# Patient Record
Sex: Female | Born: 1970 | Race: Black or African American | Hispanic: No | Marital: Single | Smoking: Current some day smoker
Health system: Southern US, Community
[De-identification: ages and names within clinical notes are randomized; demographics above are authoritative.]

## PROBLEM LIST (undated history)

## (undated) DIAGNOSIS — L309 Dermatitis, unspecified: Secondary | ICD-10-CM

## (undated) HISTORY — PX: KNEE SURGERY: SHX244

---

## 2006-05-03 ENCOUNTER — Emergency Department (HOSPITAL_COMMUNITY): Admission: EM | Admit: 2006-05-03 | Discharge: 2006-05-03 | Payer: Self-pay | Admitting: Family Medicine

## 2006-05-30 ENCOUNTER — Emergency Department (HOSPITAL_COMMUNITY): Admission: EM | Admit: 2006-05-30 | Discharge: 2006-05-30 | Payer: Self-pay | Admitting: Emergency Medicine

## 2007-06-16 ENCOUNTER — Emergency Department (HOSPITAL_COMMUNITY): Admission: EM | Admit: 2007-06-16 | Discharge: 2007-06-17 | Payer: Self-pay | Admitting: Emergency Medicine

## 2007-08-20 ENCOUNTER — Emergency Department (HOSPITAL_COMMUNITY): Admission: EM | Admit: 2007-08-20 | Discharge: 2007-08-20 | Payer: Self-pay | Admitting: Emergency Medicine

## 2008-01-24 ENCOUNTER — Emergency Department (HOSPITAL_COMMUNITY): Admission: EM | Admit: 2008-01-24 | Discharge: 2008-01-24 | Payer: Self-pay | Admitting: Emergency Medicine

## 2008-01-28 ENCOUNTER — Emergency Department (HOSPITAL_COMMUNITY): Admission: EM | Admit: 2008-01-28 | Discharge: 2008-01-28 | Payer: Self-pay | Admitting: Family Medicine

## 2010-07-04 ENCOUNTER — Inpatient Hospital Stay (INDEPENDENT_AMBULATORY_CARE_PROVIDER_SITE_OTHER)
Admission: RE | Admit: 2010-07-04 | Discharge: 2010-07-04 | Disposition: A | Discharge status: Home / Self Care | Payer: Self-pay | Source: Ambulatory Visit | Attending: Emergency Medicine | Admitting: Emergency Medicine

## 2010-07-04 DIAGNOSIS — J020 Streptococcal pharyngitis: Secondary | ICD-10-CM

## 2010-07-04 DIAGNOSIS — J039 Acute tonsillitis, unspecified: Secondary | ICD-10-CM

## 2010-07-04 LAB — POCT RAPID STREP A (OFFICE): Streptococcus, Group A Screen (Direct): POSITIVE — AB

## 2010-07-07 ENCOUNTER — Inpatient Hospital Stay (INDEPENDENT_AMBULATORY_CARE_PROVIDER_SITE_OTHER)
Admission: RE | Admit: 2010-07-07 | Discharge: 2010-07-07 | Disposition: A | Discharge status: Home / Self Care | Payer: Self-pay | Source: Ambulatory Visit | Attending: Family Medicine | Admitting: Family Medicine

## 2010-07-07 DIAGNOSIS — J020 Streptococcal pharyngitis: Secondary | ICD-10-CM

## 2010-07-07 DIAGNOSIS — B009 Herpesviral infection, unspecified: Secondary | ICD-10-CM

## 2010-12-15 LAB — RAPID STREP SCREEN (MED CTR MEBANE ONLY): Streptococcus, Group A Screen (Direct): NEGATIVE

## 2010-12-23 LAB — GC/CHLAMYDIA PROBE AMP, GENITAL
Chlamydia, DNA Probe: NEGATIVE
GC Probe Amp, Genital: NEGATIVE

## 2010-12-23 LAB — WET PREP, GENITAL

## 2010-12-23 LAB — RAPID STREP SCREEN (MED CTR MEBANE ONLY): Streptococcus, Group A Screen (Direct): POSITIVE — AB

## 2011-02-19 ENCOUNTER — Emergency Department (INDEPENDENT_AMBULATORY_CARE_PROVIDER_SITE_OTHER)
Admission: EM | Admit: 2011-02-19 | Discharge: 2011-02-19 | Disposition: A | Discharge status: Home / Self Care | Payer: Self-pay | Source: Home / Self Care | Attending: Family Medicine | Admitting: Family Medicine

## 2011-02-19 ENCOUNTER — Encounter: Payer: Self-pay | Admitting: Emergency Medicine

## 2011-02-19 DIAGNOSIS — L309 Dermatitis, unspecified: Secondary | ICD-10-CM

## 2011-02-19 DIAGNOSIS — L259 Unspecified contact dermatitis, unspecified cause: Secondary | ICD-10-CM

## 2011-02-19 HISTORY — DX: Dermatitis, unspecified: L30.9

## 2011-02-19 MED ORDER — METHYLPREDNISOLONE ACETATE 80 MG/ML IJ SUSP
80.0000 mg | Freq: Once | INTRAMUSCULAR | Status: AC
  Administered 2011-02-19: 80 mg via INTRAMUSCULAR

## 2011-02-19 MED ORDER — TRIAMCINOLONE ACETONIDE 0.1 % EX CREA: TOPICAL_CREAM | Freq: Two times a day (BID) | CUTANEOUS | Status: DC

## 2011-02-19 MED ORDER — METHYLPREDNISOLONE ACETATE 80 MG/ML IJ SUSP
INTRAMUSCULAR | Status: AC
Start: 2011-02-19 — End: 2011-02-19
  Filled 2011-02-19: qty 1

## 2011-02-19 NOTE — ED Notes (Signed)
Multiple complaints.  Left upper back/shoulder soreness.  Patient reports moving boxes approx 2 weeks ago.  Pain continues.  Also concerned about eczema.

## 2011-02-19 NOTE — ED Notes (Signed)
Patient concerned for shoulder pain.  Dawn evaluated primary complaint-eczema.  This nurse instructed patient , dawn pa would come back in to evaluate patient's shoulder complaint.  Patient declined.  Patient did have questions related to use of triamcinolone cream on face, verified answers.  Patient agreeable.  Again offered dawn, pa to evaluate shoulder--declined.  Patient requested note for work, provided note that patient seen today

## 2011-02-19 NOTE — ED Provider Notes (Signed)
History     CSN: [id number] Arrival date & time: 02/19/2011 10:22 AM   First MD Initiated Contact with Patient 02/19/11 1005      Chief Complaint  Patient presents with  . Eczema    (Consider location/radiation/quality/duration/timing/severity/associated sxs/prior treatment) HPI Comments: Hx of eczema. Pt states for one week has had itching and dry skin, and has rapidly worsened in the last 3 days around her neck and eyes. She has been using moisturizing lotion at home trying to help, and has had improvement on her back only. Her skin seems to worsen while she is at work, new job,  working around boxes. She has used Triamcinolone cream in the past and has worked well but is out. Her PCP is in [place] at [facility].   The history is provided by the patient.    History reviewed. No pertinent past medical history.  History reviewed. No pertinent past surgical history.  No family history on file.  History  Substance Use Topics  . Smoking status: Not on file  . Smokeless tobacco: Not on file  . Alcohol Use: Not on file    OB History    Grav Para Term Preterm Abortions TAB SAB Ect Mult Living                  Review of Systems  Constitutional: Negative for fever and chills.  Skin: Positive for rash.    Allergies  Review of patient's allergies indicates no known allergies.  Home Medications   Current Outpatient Rx  Name Route Sig Dispense Refill  . OVER THE COUNTER MEDICATION  cortizone 10 lotion     . TRIAMCINOLONE ACETONIDE 0.1 % EX CREA Topical Apply topically 2 (two) times daily. 15 g 0    BP 94/58  Pulse 80  Temp(Src) 98.4 F (36.9 C) (Oral)  Resp 16  SpO2 100%  Physical Exam  Nursing note and vitals reviewed. Constitutional: She appears well-developed and well-nourished. No distress.  Cardiovascular: Normal rate, regular rhythm and normal heart sounds.   Pulmonary/Chest: Effort normal and breath sounds normal. No respiratory  distress.  Skin: Skin is warm and dry. Rash noted.          Hyperpigmented and dry patches noted bilat periorbital, Rt ear, and posterior neck.   Psychiatric: She has a normal mood and affect.    ED Course  Procedures (including critical care time)  Labs Reviewed - No data to display No results found.   1. Eczema       MDM  Pt seen by myself before nurse triage for her complaint of eczema rash. When nurse triaged pt she also c/o Lt shoulder/back soreness. Offered to re-examine pt for new complaint but pt left/ was discharged.        Melody Comas, PA 02/19/11 [address] [place], [place] 02/19/11 1222

## 2011-02-26 NOTE — ED Provider Notes (Signed)
Medical screening examination/treatment/procedure(s) were performed by non-physician practitioner and as supervising physician I was immediately available for consultation/collaboration.  LYKINS,KIMBERLY G  D.O.    Randa Spike, MD 02/26/11 [phone]

## 2011-04-28 ENCOUNTER — Emergency Department (HOSPITAL_COMMUNITY): Payer: Self-pay

## 2011-04-28 ENCOUNTER — Encounter (HOSPITAL_COMMUNITY): Payer: Self-pay | Admitting: *Deleted

## 2011-04-28 ENCOUNTER — Emergency Department (HOSPITAL_COMMUNITY)
Admission: EM | Admit: 2011-04-28 | Discharge: 2011-04-28 | Disposition: A | Discharge status: Home / Self Care | Payer: Self-pay | Attending: Emergency Medicine | Admitting: Emergency Medicine

## 2011-04-28 DIAGNOSIS — R062 Wheezing: Secondary | ICD-10-CM | POA: Insufficient documentation

## 2011-04-28 DIAGNOSIS — L309 Dermatitis, unspecified: Secondary | ICD-10-CM

## 2011-04-28 DIAGNOSIS — J40 Bronchitis, not specified as acute or chronic: Secondary | ICD-10-CM | POA: Insufficient documentation

## 2011-04-28 DIAGNOSIS — R05 Cough: Secondary | ICD-10-CM | POA: Insufficient documentation

## 2011-04-28 DIAGNOSIS — L259 Unspecified contact dermatitis, unspecified cause: Secondary | ICD-10-CM | POA: Insufficient documentation

## 2011-04-28 DIAGNOSIS — L299 Pruritus, unspecified: Secondary | ICD-10-CM | POA: Insufficient documentation

## 2011-04-28 DIAGNOSIS — R059 Cough, unspecified: Secondary | ICD-10-CM | POA: Insufficient documentation

## 2011-04-28 DIAGNOSIS — F17200 Nicotine dependence, unspecified, uncomplicated: Secondary | ICD-10-CM | POA: Insufficient documentation

## 2011-04-28 MED ORDER — IPRATROPIUM BROMIDE 0.02 % IN SOLN
0.5000 mg | Freq: Once | RESPIRATORY_TRACT | Status: AC
  Administered 2011-04-28: 0.5 mg via RESPIRATORY_TRACT
  Filled 2011-04-28: qty 2.5

## 2011-04-28 MED ORDER — TRIAMCINOLONE ACETONIDE 0.1 % EX CREA: TOPICAL_CREAM | Freq: Two times a day (BID) | CUTANEOUS | Status: DC

## 2011-04-28 MED ORDER — ALBUTEROL SULFATE HFA 108 (90 BASE) MCG/ACT IN AERS
2.0000 | INHALATION_SPRAY | RESPIRATORY_TRACT | Status: DC | PRN
  Filled 2011-04-28: qty 6.7

## 2011-04-28 MED ORDER — ALBUTEROL SULFATE (5 MG/ML) 0.5% IN NEBU
5.0000 mg | INHALATION_SOLUTION | Freq: Once | RESPIRATORY_TRACT | Status: AC
  Administered 2011-04-28: 5 mg via RESPIRATORY_TRACT
  Filled 2011-04-28: qty 1

## 2011-04-28 NOTE — ED Provider Notes (Signed)
History     CSN: [id number]  Arrival date & time 04/28/11  1333   First MD Initiated Contact with Patient 04/28/11 1349      Chief Complaint  Patient presents with  . URI  . Rash    (Consider location/radiation/quality/duration/timing/severity/associated sxs/prior treatment) Patient is a 40 y.o. female presenting with URI. The history is provided by the patient.  URI The primary symptoms include cough, wheezing and rash. Primary symptoms do not include fever, nausea or vomiting. The current episode started 3 to 5 days ago. This is a new problem. The problem has been gradually worsening.  The cough began 3 to 5 days ago. The cough is new. The cough is non-productive, dry and hacking.  The rash began 2 to 7 days ago. The rash appears on the neck and face. The pain associated with the rash is moderate. The rash is associated with itching.  The onset of the illness is associated with exposure to sick contacts. The illness is not associated with sinus pressure, congestion or rhinorrhea.    Past Medical History  Diagnosis Date  . Eczema     History reviewed. No pertinent past surgical history.  No family history on file.  History  Substance Use Topics  . Smoking status: Current Everyday Smoker -- 0.5 packs/day  . Smokeless tobacco: Not on file  . Alcohol Use: 0.6 oz/week    1 Shots of liquor per week     occ    OB History    Grav Para Term Preterm Abortions TAB SAB Ect Mult Living                  Review of Systems  Constitutional: Negative for fever.  HENT: Negative for congestion, rhinorrhea and sinus pressure.   Respiratory: Positive for cough and wheezing.   Gastrointestinal: Negative for nausea and vomiting.  Skin: Positive for itching and rash.  All other systems reviewed and are negative.    Allergies  Review of patient's allergies indicates no known allergies.  Home Medications   Current Outpatient Rx  Name Route Sig Dispense Refill  . HYDROCORTISONE  1 % EX CREA Topical Apply 1 application topically 2 (two) times daily as needed. For eczema    . TRIAMCINOLONE ACETONIDE 0.1 % EX CREA Topical Apply 1 application topically 2 (two) times daily.      BP 112/79  Pulse 79  Temp(Src) 97.9 F (36.6 C) (Oral)  Resp 18  SpO2 100%  LMP 04/20/2011  Physical Exam  Nursing note and vitals reviewed. Constitutional: She is oriented to person, place, and time. She appears well-developed and well-nourished. No distress.  HENT:  Head: Normocephalic and atraumatic.  Right Ear: Tympanic membrane and ear canal normal.  Left Ear: Tympanic membrane and ear canal normal.  Mouth/Throat: Oropharynx is clear and moist.  Eyes: EOM are normal. Pupils are equal, round, and reactive to light.  Cardiovascular: Normal rate, regular rhythm, normal heart sounds and intact distal pulses.  Exam reveals no friction rub.   No murmur heard. Pulmonary/Chest: Effort normal. She has wheezes. She has no rales.  Abdominal: Soft. Bowel sounds are normal. She exhibits no distension. There is no tenderness. There is no rebound and no guarding.  Musculoskeletal: Normal range of motion. She exhibits no tenderness.       No edema  Neurological: She is alert and oriented to person, place, and time. No cranial nerve deficit.  Skin: Skin is warm and dry. Rash noted. Rash is maculopapular.  Psychiatric: She has a normal mood and affect. Her behavior is normal.    ED Course  Procedures (including critical care time)  Labs Reviewed - No data to display Dg Chest 2 View  04/28/2011  *RADIOLOGY REPORT*  Clinical Data: Upper respiratory tract infection.  Cough.  CHEST - 2 VIEW  Comparison: None.  Findings: There is some peribronchial thickening.  No consolidative process, pneumothorax or effusion.  Heart size is normal.  No focal bony abnormality.  IMPRESSION: Bronchitic change without focal process.  Original Report Authenticated By: Bernadene Bell. Maricela Curet, M.D.     No diagnosis  found.    MDM   Pt with symptoms consistent with viral URI last week and now is having coughing and difficulty breathing..  Well appearing here.  No signs of pharyngitis, otitis or abnormal abdominal findings.  Wheezing diffusely on exam. CXR wnl and pt to return with any further problems.  After albuterol and Atrovent peak flow 75%. Wheezing has resolved. Patient is a smoker and has a history of wheezing. Her that she should have an inhaler. No need for steroids at this time.  Also patient has a history of eczema and layer over her neck and face. She states she is out of the triamcinolone cream and will refill.         Gwyneth Sprout, MD 04/28/11 [phone]

## 2011-04-28 NOTE — ED Notes (Signed)
Pt is here with cold symptoms and cough.  Aches.  PT has outbreak of eczema.  Congestion in chest

## 2011-07-05 ENCOUNTER — Emergency Department (HOSPITAL_COMMUNITY)
Admission: EM | Admit: 2011-07-05 | Discharge: 2011-07-05 | Disposition: A | Discharge status: Home / Self Care | Payer: Medicaid Other | Attending: Emergency Medicine | Admitting: Emergency Medicine

## 2011-07-05 ENCOUNTER — Encounter (HOSPITAL_COMMUNITY): Payer: Self-pay | Admitting: *Deleted

## 2011-07-05 DIAGNOSIS — K0889 Other specified disorders of teeth and supporting structures: Secondary | ICD-10-CM

## 2011-07-05 DIAGNOSIS — R220 Localized swelling, mass and lump, head: Secondary | ICD-10-CM | POA: Insufficient documentation

## 2011-07-05 DIAGNOSIS — R6884 Jaw pain: Secondary | ICD-10-CM | POA: Insufficient documentation

## 2011-07-05 DIAGNOSIS — K089 Disorder of teeth and supporting structures, unspecified: Secondary | ICD-10-CM | POA: Insufficient documentation

## 2011-07-05 DIAGNOSIS — H9209 Otalgia, unspecified ear: Secondary | ICD-10-CM | POA: Insufficient documentation

## 2011-07-05 MED ORDER — OXYCODONE-ACETAMINOPHEN 5-325 MG PO TABS
1.0000 | ORAL_TABLET | Freq: Once | ORAL | Status: AC
  Administered 2011-07-05: 1 via ORAL
  Filled 2011-07-05: qty 1

## 2011-07-05 MED ORDER — OXYCODONE-ACETAMINOPHEN 5-325 MG PO TABS: 1.0000 | ORAL_TABLET | Freq: Four times a day (QID) | ORAL | Status: AC | PRN

## 2011-07-05 NOTE — ED Provider Notes (Signed)
History     CSN: [id number]  Arrival date & time 07/05/11  0559   First MD Initiated Contact with Patient 07/05/11 0730      Chief Complaint  Patient presents with  . Dental Pain    (Consider location/radiation/quality/duration/timing/severity/associated sxs/prior treatment) HPI Comments: Patient presents to the emergency department with a dental complaint. Symptoms began 2-3 weeks ago. The patient has tried to alleviate pain with NSAIDs.  Pain rated at a 10/10, characterized as throbbing in nature and located right upper jaw, radiates to right ear. Patient denies fever, night sweats, chills, difficulty swallowing or opening mouth, SOB, nuchal rigidity or decreased ROM of neck.  Patient does not have a dentist and requests a resource guide at discharge.   Patient is a 41 y.o. female presenting with tooth pain. The history is provided by the patient.  Dental PainPrimary symptoms do not include headaches, fever, shortness of breath or sore throat.  Additional symptoms do not include: facial swelling, trouble swallowing, drooling and ear pain.    Past Medical History  Diagnosis Date  . Eczema     History reviewed. No pertinent past surgical history.  History reviewed. No pertinent family history.  History  Substance Use Topics  . Smoking status: Current Everyday Smoker -- 0.5 packs/day  . Smokeless tobacco: Not on file  . Alcohol Use: 0.6 oz/week    1 Shots of liquor per week     occ    OB History    Grav Para Term Preterm Abortions TAB SAB Ect Mult Living                  Review of Systems  Constitutional: Negative for fever, chills, diaphoresis and activity change.  HENT: Positive for dental problem. Negative for ear pain, sore throat, facial swelling, drooling, mouth sores, trouble swallowing, neck pain, neck stiffness, voice change, sinus pressure and tinnitus.   Eyes: Negative for pain and visual disturbance.  Respiratory: Negative for shortness of breath,  wheezing and stridor.   Cardiovascular: Negative for chest pain.  Gastrointestinal: Negative for nausea and abdominal pain.  Musculoskeletal: Negative for myalgias.  Skin: Negative for rash.  Neurological: Negative for speech difficulty and headaches.  Hematological: Negative for adenopathy.  All other systems reviewed and are negative.    Allergies  Review of patient's allergies indicates no known allergies.  Home Medications   Current Outpatient Rx  Name Route Sig Dispense Refill  . ALBUTEROL SULFATE HFA 108 (90 BASE) MCG/ACT IN AERS Inhalation Inhale 2 puffs into the lungs every 6 (six) hours as needed. For shortness of breath    . TRIAMCINOLONE ACETONIDE 0.1 % EX CREA Topical Apply 1 application topically 2 (two) times daily.      BP 122/80  Pulse 71  Temp(Src) 97.1 F (36.2 C) (Oral)  Resp 18  SpO2 100%  Physical Exam  Nursing note and vitals reviewed. Constitutional: She is oriented to person, place, and time. She appears well-developed and well-nourished. No distress.  HENT:  Head: Normocephalic and atraumatic. No trismus in the jaw.  Mouth/Throat: Uvula is midline, oropharynx is clear and moist and mucous membranes are normal. Abnormal dentition. No dental abscesses or uvula swelling. No oropharyngeal exudate, posterior oropharyngeal edema, posterior oropharyngeal erythema or tonsillar abscesses.         Poor dental hygiene. Pt able to open and close mouth with out difficulty. Airway intact. Uvula midline. Mild gingival swelling with tenderness over affected area, but no fluctuance. No swelling or tenderness of  submental and submandibular regions. No exposed puplp of effected tooth  Eyes: Conjunctivae and EOM are normal.  Neck: Normal range of motion and full passive range of motion without pain. Neck supple.  Cardiovascular: Normal rate and regular rhythm.   Pulmonary/Chest: Effort normal and breath sounds normal. No stridor. No respiratory distress. She has no  wheezes.  Musculoskeletal: Normal range of motion.  Lymphadenopathy:       Head (right side): No submental, no submandibular, no tonsillar, no preauricular and no posterior auricular adenopathy present.       Head (left side): No submental, no submandibular, no tonsillar, no preauricular and no posterior auricular adenopathy present.    She has no cervical adenopathy.  Neurological: She is alert and oriented to person, place, and time.  Skin: Skin is warm and dry. No rash noted. She is not diaphoretic.    ED Course  Procedures (including critical care time)  Labs Reviewed - No data to display No results found.   No diagnosis found.    MDM  Dental pain  Patient with toothache.  No gross abscess.  Exam unconcerning for Ludwig's angina or spread of infection.  NO exposed pulp or dental fracture, abx not currently indicated. Discussed in great detail reasons to return to ED including inability to open mouth, development of fever or abscess. Will treat with  pain medicine.  Urged patient to follow-up with dentist.  Pt is agreeable with plan and verbalizes understanding.          Jaci Carrel, [place] 07/05/11 [phone]

## 2011-07-05 NOTE — ED Notes (Signed)
Pt states that she had right sided toothache for 2 weeks. Pain has been increasing gradually.

## 2011-07-05 NOTE — Discharge Instructions (Signed)
You have a dental injury. Use the resource guide listed below to help you find a dentist if you do not already have one to followup with. It is very important that you get evaluated by a dentist as soon as possible. Call tomorrow to schedule an appointment. Use your pain medication as prescribed and do not operate heavy machinery while on pain medication. Note that your pain medication contains acetaminophen (Tylenol) & its is not reccommended that you use additional acetaminophen (Tylenol) while taking this medication. Take your full course of antibiotics. Read the instructions below.  Eat a soft or liquid diet and rinse your mouth out after meals with warm water. You should see a dentist or return here at once if you have increased swelling, increased pain or uncontrolled bleeding from the site of your injury.   SEEK MEDICAL CARE IF:   You have increased pain not controlled with medicines.   You have swelling around your tooth, in your face or neck.   You have bleeding which starts, continues, or gets worse.   You have a fever >101  If you are unable to open your mouth  RESOURCE GUIDE  Dental Problems  Patients with Medicaid: [facility]                     [facility] [address]                                           [address] Phone:  [phone]                                                  Phone:  [phone]  If unable to pay or uninsured, contact:  [facility] or [facility] to become qualified for the adult dental clinic.  Chronic Pain Problems Contact [facility]  [phone] Patients need to be referred by their primary care doctor.  Insufficient Money for Medicine Contact [employer]:  call "211" or [facility] 271-5999.  No Primary Care Doctor Call [facility]  832-8000 Other agencies that provide inexpensive medical care    [facility]  832-8035    [facility]  832-7272    [facility]  [phone]    Women's Clinic  832-4777    [facility]  [phone]    [facility]  [phone]  Psychological Services [facility]  [phone] [facility]  [phone] [facility]   800 853-5163 (emergency services 641-4993)  Substance Abuse Resources [facility]  336-882-2125 [facility] 336-784-9470 [facility] 336-285-9073 [facility] 336-845-3988 Residential & Outpatient Substance Abuse Program  800-659-3381  Abuse/Neglect [place] Child Abuse Hotline (336) 641-3795 [place] Child Abuse Hotline 800-378-5315 (After Hours)  Emergency Shelter [facility] (336) 271-5985  M[facility] (336) 275-9566 [facility] (704) 372-4663  MRSA Hotline #:   832-7006    [place] Resources  [facility] [place]     [employer]                            [facility] [address] [place]                       [address]      [address]  [place]                                                [place]                            [place] Phone:  [phone]                                   Phone:  [phone]                 Phone:  [phone]  [facility] Phone:  [phone]  [place] Child Abuse Hotline (336) 342-1394 (336) 342-3537 (After Hours)    

## 2011-07-18 NOTE — ED Provider Notes (Signed)
Evaluation and management procedures were performed by the PA/NP/resident physician under my supervision/collaboration.   Adaiah Jaskot D Ziza Hastings, MD 07/18/11 2025 

## 2011-09-07 ENCOUNTER — Emergency Department (HOSPITAL_COMMUNITY): Payer: Medicaid Other

## 2011-09-07 ENCOUNTER — Encounter (HOSPITAL_COMMUNITY): Payer: Self-pay | Admitting: *Deleted

## 2011-09-07 ENCOUNTER — Emergency Department (HOSPITAL_COMMUNITY)
Admission: EM | Admit: 2011-09-07 | Discharge: 2011-09-07 | Disposition: A | Discharge status: Home / Self Care | Payer: Medicaid Other | Attending: Emergency Medicine | Admitting: Emergency Medicine

## 2011-09-07 DIAGNOSIS — M79672 Pain in left foot: Secondary | ICD-10-CM

## 2011-09-07 DIAGNOSIS — M79609 Pain in unspecified limb: Secondary | ICD-10-CM | POA: Insufficient documentation

## 2011-09-07 DIAGNOSIS — F17200 Nicotine dependence, unspecified, uncomplicated: Secondary | ICD-10-CM | POA: Insufficient documentation

## 2011-09-07 NOTE — ED Provider Notes (Signed)
Medical screening examination/treatment/procedure(s) were performed by non-physician practitioner and as supervising physician I was immediately available for consultation/collaboration.   Dione Booze, MD 09/07/11 2007

## 2011-09-07 NOTE — ED Provider Notes (Signed)
History     CSN: [id number]  Arrival date & time 09/07/11  1504   First MD Initiated Contact with Patient 09/07/11 1552      Chief Complaint  Patient presents with  . Toe Injury    left pinkie    (Consider location/radiation/quality/duration/timing/severity/associated sxs/prior treatment) HPI Comments: Pt states she "stubbed" her L 5th toe on a door this am and has increasing pain to the same since. Pain worsens with wt bearing to the area. No treatment PTA.  Patient is a 41 y.o. female presenting with toe pain. The history is provided by the patient.  Toe Pain This is a new problem. The current episode started today. The problem occurs constantly. The problem has been unchanged. Pertinent negatives include no joint swelling, numbness or weakness. The symptoms are aggravated by walking. She has tried nothing for the symptoms. The treatment provided no relief.    Past Medical History  Diagnosis Date  . Eczema     History reviewed. No pertinent past surgical history.  No family history on file.  History  Substance Use Topics  . Smoking status: Current Everyday Smoker -- 0.5 packs/day  . Smokeless tobacco: Not on file  . Alcohol Use: 0.6 oz/week    1 Shots of liquor per week     occ    OB History    Grav Para Term Preterm Abortions TAB SAB Ect Mult Living                  Review of Systems  Constitutional: Negative.   Musculoskeletal: Negative for joint swelling.  Skin: Negative.   Neurological: Negative for weakness and numbness.    Allergies  Banana and Peach  Home Medications   Current Outpatient Rx  Name Route Sig Dispense Refill  . IBUPROFEN 200 MG PO TABS Oral Take 800 mg by mouth every 6 (six) hours as needed. For cramps    . OXYCODONE-ACETAMINOPHEN 5-325 MG PO TABS Oral Take 2 tablets by mouth every 4 (four) hours as needed. For pain    . TRIAMCINOLONE ACETONIDE 0.1 % EX CREA Topical Apply 1 application topically 2 (two) times daily.    . ALBUTEROL  SULFATE HFA 108 (90 BASE) MCG/ACT IN AERS Inhalation Inhale 2 puffs into the lungs every 6 (six) hours as needed. For shortness of breath      BP 109/65  Pulse 72  Temp 98.3 F (36.8 C) (Oral)  Resp 20  SpO2 98%  LMP 09/07/2011  Physical Exam  Nursing note and vitals reviewed. Constitutional: She appears well-developed and well-nourished. No distress.  HENT:  Head: Normocephalic and atraumatic.  Neck: Normal range of motion.  Cardiovascular: Normal rate.   Pulmonary/Chest: Effort normal.  Musculoskeletal: Normal range of motion.       Feet:       Tender over distal 5th MT - no swelling, deformity, crepitus. NT to toe itself. Pt ambulatory with a limp  Neurological: She is alert.  Skin: Skin is warm and dry. She is not diaphoretic.  Psychiatric: She has a normal mood and affect.    ED Course  Procedures (including critical care time)  Labs Reviewed - No data to display Dg Foot Complete Left  09/07/2011  *RADIOLOGY REPORT*  Clinical Data: Little toe injury.  LEFT FOOT - COMPLETE 3+ VIEW  Comparison: None.  Findings: Imaged bones, joints and soft tissues appear normal.  IMPRESSION: Negative exam.  Original Report Authenticated By: Bernadene Bell. Maricela Curet, M.D.     1.  Foot pain, left       MDM  Pt with pain to foot after colliding with a door today - distal 5th MT tenderness - xrays reviewed by me and negative. RICE advised. Ibuprofen for pain. Return precautions discussed.     Grant Fontana, PA-C 09/07/11 1657

## 2011-09-07 NOTE — ED Notes (Signed)
Pt stumped left pinkie toe this am and states pain is increasing

## 2011-09-07 NOTE — Discharge Instructions (Signed)
Your xrays were normal appearing today. Please place ice over any areas that are sore. Keep the foot elevated. You may "buddy tape" the 4th and 5th toes together if this makes the area more comfortable. You may take ibuprofen 800 mg three times daily as needed for pain. Return to the ED for changing pain, numbness, or any other worrisome symptoms.  RESOURCE GUIDE  Chronic Pain Problems: Contact [facility]  [phone] Patients need to be referred by their primary care doctor.  Insufficient Money for Medicine: Contact United Way:  call "211" or [facility] 7156881336.  No Primary Care Doctor: - Call Health Connect  [phone] - can help you locate a primary care doctor that  accepts your insurance, provides certain services, etc. - Physician Referral Service- [phone]  Agencies that provide inexpensive medical care: - [facility]  [phone] - [facility]  [phone] - [facility]  [phone] - Women's Clinic  [phone] - Planned Parenthood  [phone] [facility]  [phone]  Medicaid-accepting [facility] Providers: - [facility]- [address]  [phone], Mon-Fri 9am-7pm, Sat 9am-1pm - [facility]- [address] [place], [address] [place]  [phone] - [facility]- [address] [place]  [phone] [facility]- [address]  [phone] - Renaye Rakers- [address] [place], [address], [phone]  Only accepts [place] patients after they have their name  applied to their card  Self Pay (no insurance) in [place]: - Sickle Cell Patients: Dr Willey Blade, [facility]  [address] [place], [phone] - [facility]- [address] [place]  [phone]       [facility] [place]- [address]       -     [facility]- see  information above (Speak to [employer] if you do not have insurance)       -  [facility]- [address] [place], [phone]       -  [facility]- [address],  [phone]       -  [facility]- [address] [phone]       -  Dr Julio Sicks-  [address], [place], [phone]       -  [facility]- [address] [phone]       -  [facility]- [address] [phone], also [address] [phone]       -    [facility]- [address] [place], [phone], 1st & 3rd Saturday   every month, 10am-1pm  1) Find a Doctor and Pay Out of Pocket Although you won't have to find out who is covered by your insurance plan, it is a good idea to ask around and get recommendations. You will then need to call the office and see if the doctor you have chosen will accept you as a new patient and what types of options they offer for patients who are self-pay. Some doctors offer discounts or will set up payment plans for their patients who do not have insurance, but you will need to ask so you aren't surprised when you get to your appointment.  2) Contact Your Local Health Department Not  all health departments have doctors that can see patients for sick visits, but many do, so it is worth a call to see if yours does. If you don't know where your local health department is, you can check in your phone book. The CDC also has a tool to help you locate your state's health department, and many state websites also have listings of all of their local health departments.  3) Find a [place] [facility] If your illness is not likely to be very severe or complicated, you may want to try a walk in clinic. These are popping up all over the country in pharmacies, drugstores, and shopping centers. They're usually staffed by nurse practitioners or physician assistants that have been trained to treat common illnesses and complaints. They're usually fairly  quick and inexpensive. However, if you have serious medical issues or chronic medical problems, these are probably not your best option  STD Testing - [place], [facility], [address] [place], phone [phone] or [phone].  Monday - Friday, call for an appointment. - [place], STD Clinic, [address], [place], phone [phone] or [phone].  Monday - Friday, call for an appointment.  Abuse/Neglect: - [place] [phone] - [place] [phone] (After Hours)  Emergency Shelter:  [facility] [phone]  Maternity Homes: - [facility] [phone] - [place] [phone]  MRSA Hotline #:   947 172 8800  [facility] Dept. [address]         [place] Phone:  [phone]                                  Phone:  [phone]                   Phone:  [phone]  [place], [place] in [place], [address],                                  [place] [phone] or [phone] (After Hours)   [place]  Substance Abuse Resources: - Alcohol and Drug Services  [phone] - [place] [phone] - [facility] [place]  [phone]  Psychological Services: - [place]   [place]  [place]  [facility] [place], [address] [place], ACCESS LINE: [phone] or [phone], EntrepreneurLoan.[email or web address]  Dental Assistance  If unable to pay or uninsured, contact:  [facility] or [facility]. to become qualified for the adult dental clinic.  Patients with Medicaid: [facility] [phone] [address], [phone] [address], [phone]  If unable to pay, or uninsured, contact [facility] [phone]) or [facility] Department [phone] in [place], [phone] in [facility]) to become qualified for the adult dental clinic  Other Low-Cost Community Dental Services: - [facility]- [address] [place], [place], [place], [address], [phone], Ext. [phone], 2nd and 4th Thursday of the month at 6:30am.  10 clients each day by appointment, can sometimes see walk-in patients if someone does not show for an appointment. [facility]- [address] Ether [place]ins [place], [place], [address], [phone] - [facility]- [address] [place], [place], [address], [phone] - [facility]- [phone] [facility] Department- [phone] [facility] Department- [phone]

## 2011-09-12 ENCOUNTER — Emergency Department (HOSPITAL_COMMUNITY)
Admission: EM | Admit: 2011-09-12 | Discharge: 2011-09-12 | Disposition: A | Discharge status: Home / Self Care | Payer: Medicaid Other | Attending: Emergency Medicine | Admitting: Emergency Medicine

## 2011-09-12 ENCOUNTER — Encounter (HOSPITAL_COMMUNITY): Payer: Self-pay | Admitting: Physical Medicine and Rehabilitation

## 2011-09-12 ENCOUNTER — Emergency Department (HOSPITAL_COMMUNITY): Payer: Medicaid Other

## 2011-09-12 DIAGNOSIS — Y92009 Unspecified place in unspecified non-institutional (private) residence as the place of occurrence of the external cause: Secondary | ICD-10-CM | POA: Insufficient documentation

## 2011-09-12 DIAGNOSIS — W278XXA Contact with other nonpowered hand tool, initial encounter: Secondary | ICD-10-CM | POA: Insufficient documentation

## 2011-09-12 DIAGNOSIS — S90859A Superficial foreign body, unspecified foot, initial encounter: Secondary | ICD-10-CM

## 2011-09-12 DIAGNOSIS — Z1811 Retained magnetic metal fragments: Secondary | ICD-10-CM | POA: Insufficient documentation

## 2011-09-12 DIAGNOSIS — Y998 Other external cause status: Secondary | ICD-10-CM | POA: Insufficient documentation

## 2011-09-12 DIAGNOSIS — S91309A Unspecified open wound, unspecified foot, initial encounter: Secondary | ICD-10-CM | POA: Insufficient documentation

## 2011-09-12 MED ORDER — HYDROCODONE-ACETAMINOPHEN 5-325 MG PO TABS: 1.0000 | ORAL_TABLET | ORAL | Status: AC | PRN

## 2011-09-12 MED ORDER — TETANUS-DIPHTH-ACELL PERTUSSIS 5-2.5-18.5 LF-MCG/0.5 IM SUSP
0.5000 mL | Freq: Once | INTRAMUSCULAR | Status: AC
  Administered 2011-09-12: 0.5 mL via INTRAMUSCULAR
  Filled 2011-09-12: qty 0.5

## 2011-09-12 MED ORDER — AMOXICILLIN-POT CLAVULANATE 875-125 MG PO TABS: 1.0000 | ORAL_TABLET | Freq: Two times a day (BID) | ORAL | Status: AC

## 2011-09-12 MED ORDER — HYDROCODONE-ACETAMINOPHEN 5-325 MG PO TABS
1.0000 | ORAL_TABLET | Freq: Once | ORAL | Status: AC
  Administered 2011-09-12: 1 via ORAL
  Filled 2011-09-12: qty 1

## 2011-09-12 MED ORDER — HYDROCODONE-ACETAMINOPHEN 5-325 MG PO TABS: 1.0000 | ORAL_TABLET | ORAL | Status: DC | PRN

## 2011-09-12 NOTE — ED Notes (Signed)
Pt presents to department for evaluation of R foot pain. States she stepped on sewing needle last night at home. States "I think part of the needle is still in my foot." states increased pain today. 8/10 at the time. Tetanus unknown. Pt is conscious alert and oriented x4.

## 2011-09-12 NOTE — Discharge Instructions (Signed)
You were seen and evaluated for your needle in your foot. Your providers today attempted to remove this needle but were not able to remove it. He had been given a referral for orthopedic specialist followup with for further evaluation and removal of your needle. Please keep your foot clean and dry in your wound protected until you followup. You have been given antibiotics to take to help prevent any infection. Please take these as instructed and follow up. If you develop any worsening pain, swelling, redness, bleeding or drainage from the foot please return to emergency room sooner.     Foreign Body A foreign body is something in your body that should not be there. This may have been caused by a puncture wound or other injury. Puncture wounds become easily infected. This happens when bacteria (germs) get under the skin. Rusty nails and similar foreign bodies are often dirty and carry germs on them.  TREATMENT   A foreign body is usually removed if this can be easily done right after it happens.   Sometimes they are left in and removed at a later surgery. They may be left in indefinitely if they will not cause later problems.   The following are general instructions in caring for your wound.  HOME CARE INSTRUCTIONS   A dressing, depending on the location of the wound, may have been applied. This may be changed once per day or as instructed. If the dressing sticks, it may be soaked off with soapy water or hydrogen peroxide.   Only take over-the-counter or prescription medicines for pain, discomfort, or fever as directed by your caregiver.   Be aware that your body will work to remove the foreign substance. That is, the foreign body may work itself out of the wound. That is normal.   You may have received a recommendation to follow up with your physician or a specialist. It is very important to call for or keep follow-up appointments in order to avoid infection or other complications.  SEEK  IMMEDIATE MEDICAL CARE IF:   There is redness, swelling, or increasing pain in the wound.   You notice a foul smell coming from the wound or dressing.   Pus is coming from the wound.   An unexplained oral temperature above 102 F (38.9 C) develops, or as your caregiver suggests.   There is increasing pain in the wound.  If you did not receive a tetanus shot today because you did not recall when your last one was given, check with your caregiver's office and determine if one is needed. Generally for a "dirty" wound, you should receive a tetanus booster if you have not had one in the last five years. If you have a "clean" wound, you should receive a tetanus booster if you have not had one within the last ten years. If you have a foreign body that needs removal and this was not done today, make sure you know how you are to follow up and what is the plan of action for taking care of this. It is your responsibility to follow up on this. MAKE SURE YOU:   Understand these instructions.   Will watch your condition.   Will get help right away if you are not doing well or get worse.  Document Released: 09/02/2000 Document Revised: 02/26/2011 Document Reviewed: 10/27/2007 [facility] Patient Information 2012 [place], [place].     RESOURCE GUIDE  Chronic Pain Problems: Contact [facility]  [phone] Patients need to be  referred by their primary care doctor.  Insufficient Money for Medicine: Contact United Way:  call "211" or [facility] 4433638780.  No Primary Care Doctor: - Call Health Connect  [phone] - can help you locate a primary care doctor that  accepts your insurance, provides certain services, etc. - Physician Referral Service- [phone]  Agencies that provide inexpensive medical care: - [facility]  [phone] - [facility]  [phone] - [facility]  [phone] - Women's Clinic  [phone] - [facility]  [phone] [facility]  [phone]  Medicaid-accepting [facility] Providers: - [facility]- [address]  [phone], Mon-Fri 9am-7pm, Sat 9am-1pm - [facility]- [address] [place], [address] [place]  [phone] - [facility] [address], Suite [place]  [phone] [facility]- [address]  [phone] - Renaye Rakers- [address] [place], [address], [phone]  Only accepts [place] patients after they have their name  applied to their card  Self Pay (no insurance) in [place]: - Sickle Cell Patients: Dr Willey Blade, [facility]  [address] [place], [phone] - [facility]- [address] [place]  [phone]       [facility] [place]- [address]       -     [facility]- see information above (Speak to [employer] if you do not have insurance)       -  [facility]- [address] [place], [phone]       -  [facility]- [address] [place],  [phone]       -  [facility]- [address] [phone]       -  Dr Julio Sicks-  [address], [place], [phone]       -  [facility]- [address] [place] [address], [phone]       -  [facility]- [address] [phone], also [address], [phone]       -    [facility]- [address] [place], [phone], 1st & 3rd Saturday   every month, 10am-1pm  1) Find a Doctor and Pay Out of Pocket Although you won't have to find out who is covered by your insurance plan, it is a good idea to ask around and get recommendations. You will then need to call the office and see if the doctor you have chosen will accept you as a new patient and what types of options they offer for patients who are self-pay. Some doctors offer discounts or will set up payment plans for their patients who do not have  insurance, but you will need to ask so you aren't surprised when you get to your appointment.  2) Contact Your Local Health Department Not all health departments have doctors that can see patients for sick visits, but many do, so it is worth a call to see if yours does. If you don't know where your local health department is, you can check in your phone book. The CDC also has a tool to help you locate your state's health department, and many state websites also have listings of all of their local health departments.  3) Find a Walk-in Clinic If your illness  is not likely to be very severe or complicated, you may want to try a walk in clinic. These are popping up all over the country in pharmacies, drugstores, and shopping centers. They're usually staffed by nurse practitioners or physician assistants that have been trained to treat common illnesses and complaints. They're usually fairly quick and inexpensive. However, if you have serious medical issues or chronic medical problems, these are probably not your best option  STD Testing - [facility], STD Clinic, [address], [place], phone [phone] or [phone].  Monday - Friday, call for an appointment. [facility] Department of [employer], STD Clinic, [place] [address], [place], phone [phone] or [phone].  Monday - Friday, call for an appointment.  Abuse/Neglect: [facility] Child Abuse Hotline 2244720746 [facility] Child Abuse Hotline [phone] (After Hours)  Emergency Shelter:  [facility] [phone]  Maternity Homes: - [facility] [phone] - [facility] [phone]  MRSA Hotline #:   907-041-9286  [facility] Resources  [facility] [place]  United Way [facility] Dept. [address]                 [address]         [address] [place] [address]   Blondell Reveal Phone:  [phone]                                  Phone:  [phone]                   Phone:  [phone]  [facility], [phone] - [phone]       -     [facility] in [place], [address],                                  [phone], [facility] Child Abuse Hotline 760-225-1935 or 705-598-9142 (After Hours)   Behavioral Health Services  Substance Abuse Resources: - Alcohol and Drug Services  [phone] - [facility] [phone] - [facility] [phone] Floydene Flock [phone] - Residential & Outpatient Substance Abuse Program  [phone]  Psychological Services: [facility]  [phone] Services  7273498880 - [facility], [phone] [place]. [address] [place], ACCESS LINE: 3157458316 or [phone], [email or web address].[email or web address]  Dental Assistance  If unable to pay or uninsured, contact:  [facility] or [facility]. to become qualified for the adult dental clinic.  Patients with Medicaid: [facility] [phone] [address], [phone] [address], [phone]  If unable to pay, or uninsured, contact [facility] 5707269850) or [facility] Department [phone] in [place], [phone] in [place]) to become qualified for the adult dental clinic  Other Proofreader Services: - [facility]- [address] [place], [place], [place], [address], [phone], Ext. [phone], 2nd and 4th Thursday of the month at 6:30am.  10 clients each day by appointment, can sometimes see walk-in patients if someone does not show for an appointment. [facility]- [address] Griff[address] [place], [place], [address],  [phone] - [facility]- [address], [place], [place], [address], [phone] - [facility]- [phone] [facility]- [phone] [facility] Department- [phone]

## 2011-09-12 NOTE — Progress Notes (Signed)
Orthopedic Tech Progress Note Patient Details:  Kylie Nicholson 12-Mar-1971 [id number]  Ortho Devices Type of Ortho Device: Crutches Ortho Device/Splint Interventions: Application   Nikki Dom 09/12/2011, 10:11 PM

## 2011-09-12 NOTE — ED Provider Notes (Signed)
History     CSN: [id number]  Arrival date & time 09/12/11  4098   First MD Initiated Contact with Patient 09/12/11 2003      Chief Complaint  Patient presents with  . Foot Pain    HPI  History provided by the patient. Patient is a 41 year old female with no significant past medical history who presents with concerns for foreign body in the bottom of right foot. Patient reports stepping on a sewing needle last night on the floor between 7 and 8 PM. Patient states that part of the needle broke off and she was concerned the needle remains in the bottom of her foot. She complains of swelling and pain she has pressure walks with foot. Patient has improvement with rest and elevation. Patient has also use ibuprofen with some improvement of pain. She is not using anything else for her symptoms. She denies any other aggravating or alleviating factors. She denies any erythema, bleeding or drainage. Patient is unsure of her last tetanus shot. Symptoms are described as moderate severe.   Past Medical History  Diagnosis Date  . Eczema     No past surgical history on file.  No family history on file.  History  Substance Use Topics  . Smoking status: Current Everyday Smoker -- 0.5 packs/day  . Smokeless tobacco: Not on file  . Alcohol Use: 0.6 oz/week    1 Shots of liquor per week     occ    OB History    Grav Para Term Preterm Abortions TAB SAB Ect Mult Living                  Review of Systems  Musculoskeletal:       Pain and swelling to the right foot.  Skin: Negative for rash.  Neurological: Negative for weakness and numbness.    Allergies  Banana and Peach  Home Medications   Current Outpatient Rx  Name Route Sig Dispense Refill  . ALBUTEROL SULFATE HFA 108 (90 BASE) MCG/ACT IN AERS Inhalation Inhale 2 puffs into the lungs every 6 (six) hours as needed. For shortness of breath    . IBUPROFEN 200 MG PO TABS Oral Take 800 mg by mouth every 6 (six) hours as needed. For  cramps    . OXYCODONE-ACETAMINOPHEN 5-325 MG PO TABS Oral Take 2 tablets by mouth every 4 (four) hours as needed. For pain    . TRIAMCINOLONE ACETONIDE 0.1 % EX CREA Topical Apply 1 application topically 2 (two) times daily.      BP 123/80  Pulse 99  Temp 98 F (36.7 C) (Oral)  Resp 18  SpO2 100%  LMP 09/07/2011  Physical Exam  Nursing note and vitals reviewed. Constitutional: She is oriented to person, place, and time. She appears well-developed and well-nourished. No distress.  HENT:  Head: Normocephalic.  Cardiovascular: Normal rate and regular rhythm.   Pulmonary/Chest: Effort normal and breath sounds normal.  Musculoskeletal:       There is a pinpoint puncture wound to the plantar aspect below the second MTP joint. There is mild swelling to the pad of the foot over this area. Tenderness to palpation both dorsally and plantar surface. Normal sensation, movement and cap refill in toes. No foreign bodies palpated or seen.  Neurological: She is alert and oriented to person, place, and time.  Skin: Skin is warm and dry. No rash noted.  Psychiatric: She has a normal mood and affect. Her behavior is normal.    ED  Course  FOREIGN BODY REMOVAL Performed by: Angus Seller Authorized by: Angus Seller Consent: Verbal consent obtained. Risks and benefits: risks, benefits and alternatives were discussed Consent given by: patient Patient identity confirmed: verbally with patient Time out: Immediately prior to procedure a "time out" was called to verify the correct patient, procedure, equipment, support staff and site/side marked as required. Body area: skin General location: lower extremity Location details: right foot Anesthesia: local infiltration Local anesthetic: lidocaine 2% with epinephrine Anesthetic total: 4 ml Dressing: antibiotic ointment and dressing applied Depth: deep Complexity: complex Post-procedure assessment: foreign body not removed Patient tolerance:  Patient tolerated the procedure well with no immediate complications.    Dg Foot Complete Right  09/12/2011  *RADIOLOGY REPORT*  Clinical Data: Stepped on needle.  RIGHT FOOT COMPLETE - 3+ VIEW  Comparison: None.  Findings: There is a needle within the plantar soft tissues near the third distal metatarsal.  No underlying acute bony abnormality. No fracture.  IMPRESSION: Needle within the plantar soft tissues.  Original Report Authenticated By: Cyndie Chime, M.D.     1. Foreign body in foot       MDM  Patient seen and evaluated. Patient in no acute distress.   Attempt at removing the foreign body unsuccessful. Patient referred to orthopedic surgeon for further evaluation and treatment.     Phill Mutter [place], [place] 09/13/11 [phone]

## 2011-09-12 NOTE — ED Notes (Signed)
Right foot pain, step on needle , she thinks the needle broke off and some is still in her foot and this happen last night

## 2011-09-13 NOTE — ED Provider Notes (Signed)
Medical screening examination/treatment/procedure(s) were performed by non-physician practitioner and as supervising physician I was immediately available for consultation/collaboration.  Annielee Jemmott M Omaira Mellen, MD 09/13/11 1255 

## 2012-05-02 ENCOUNTER — Emergency Department (HOSPITAL_COMMUNITY)
Admission: EM | Admit: 2012-05-02 | Discharge: 2012-05-02 | Disposition: A | Discharge status: Home / Self Care | Payer: Self-pay | Source: Home / Self Care

## 2012-05-03 ENCOUNTER — Emergency Department (HOSPITAL_COMMUNITY)
Admission: EM | Admit: 2012-05-03 | Discharge: 2012-05-04 | Disposition: A | Discharge status: Home / Self Care | Payer: Self-pay | Attending: Emergency Medicine | Admitting: Emergency Medicine

## 2012-05-03 ENCOUNTER — Encounter (HOSPITAL_COMMUNITY): Payer: Self-pay | Admitting: *Deleted

## 2012-05-03 DIAGNOSIS — F17200 Nicotine dependence, unspecified, uncomplicated: Secondary | ICD-10-CM | POA: Insufficient documentation

## 2012-05-03 DIAGNOSIS — K089 Disorder of teeth and supporting structures, unspecified: Secondary | ICD-10-CM | POA: Insufficient documentation

## 2012-05-03 DIAGNOSIS — Z872 Personal history of diseases of the skin and subcutaneous tissue: Secondary | ICD-10-CM | POA: Insufficient documentation

## 2012-05-03 DIAGNOSIS — Z79899 Other long term (current) drug therapy: Secondary | ICD-10-CM | POA: Insufficient documentation

## 2012-05-03 DIAGNOSIS — K0889 Other specified disorders of teeth and supporting structures: Secondary | ICD-10-CM

## 2012-05-03 MED ORDER — BUPIVACAINE-EPINEPHRINE PF 0.5-1:200000 % IJ SOLN: 1.8000 mL | Freq: Once | INTRAMUSCULAR | Status: DC

## 2012-05-03 NOTE — ED Notes (Signed)
Pt c/o top right jaw toothache x 2 wks

## 2012-05-04 MED ORDER — PENICILLIN V POTASSIUM 500 MG PO TABS: 500.0000 mg | ORAL_TABLET | Freq: Four times a day (QID) | ORAL | Status: DC

## 2012-05-04 MED ORDER — BUPIVACAINE-EPINEPHRINE PF 0.5-1:200000 % IJ SOLN
INTRAMUSCULAR | Status: AC
  Filled 2012-05-04: qty 1.8

## 2012-05-04 MED ORDER — HYDROCODONE-ACETAMINOPHEN 5-325 MG PO TABS: 2.0000 | ORAL_TABLET | Freq: Four times a day (QID) | ORAL | Status: DC | PRN

## 2012-05-04 NOTE — ED Provider Notes (Signed)
Medical screening examination/treatment/procedure(s) were performed by non-physician practitioner and as supervising physician I was immediately available for consultation/collaboration.  Olivia Mackie, MD 05/04/12 [phone]

## 2012-05-04 NOTE — ED Provider Notes (Signed)
History     CSN: [id number]  Arrival date & time 05/03/12  2328   First MD Initiated Contact with Patient 05/03/12 2339      Chief Complaint  Patient presents with  . Dental Pain    (Consider location/radiation/quality/duration/timing/severity/associated sxs/prior treatment) HPI Comments: This is a 42 year old female, who presents emergency department with chief complaint of dental pain. Patient states that her right ear molars have been hurting for the past month, but have worsened over the past 2 weeks. States the pain is moderate to severe. She states that she's been using Orajel with some relief. She has not been able to see a dentist, because she cannot afford one, and has not received her Medicaid yet. The pain radiates to her jaw. She denies fever, nausea, and vomiting.  The history is provided by the patient. No language interpreter was used.    Past Medical History  Diagnosis Date  . Eczema     History reviewed. No pertinent past surgical history.  No family history on file.  History  Substance Use Topics  . Smoking status: Current Every Day Smoker -- 0.50 packs/day  . Smokeless tobacco: Not on file  . Alcohol Use: 0.6 oz/week    1 Shots of liquor per week     Comment: occ    OB History   Grav Para Term Preterm Abortions TAB SAB Ect Mult Living                  Review of Systems  All other systems reviewed and are negative.    Allergies  Banana and Peach  Home Medications   Current Outpatient Rx  Name  Route  Sig  Dispense  Refill  . albuterol (PROVENTIL HFA;VENTOLIN HFA) 108 (90 BASE) MCG/ACT inhaler   Inhalation   Inhale 2 puffs into the lungs every 6 (six) hours as needed. For shortness of breath         . ibuprofen (ADVIL,MOTRIN) 200 MG tablet   Oral   Take 800 mg by mouth every 6 (six) hours as needed. For cramps         . oxyCODONE-acetaminophen (PERCOCET) 5-325 MG per tablet   Oral   Take 2 tablets by mouth every 4 (four) hours as  needed. For pain           BP 108/53  Pulse 72  Temp(Src) 98.4 F (36.9 C)  Resp 20  SpO2 100%  LMP 04/12/2012  Physical Exam  Nursing note and vitals reviewed. Constitutional: She is oriented to person, place, and time. She appears well-developed and well-nourished.  HENT:  Head: Normocephalic and atraumatic.  Mouth/Throat:    Right rear upper molars appear to have dental caries, no gingival abscesses, no peritonsillar or tonsillar abscesses, uvula is midline, airway is intact  Eyes: Conjunctivae and EOM are normal.  Neck: Normal range of motion.  Cardiovascular: Normal rate.   Pulmonary/Chest: Effort normal.  Abdominal: She exhibits no distension.  Musculoskeletal: Normal range of motion.  Neurological: She is alert and oriented to person, place, and time.  Skin: Skin is dry.  Psychiatric: She has a normal mood and affect. Her behavior is normal. Judgment and thought content normal.    ED Course  Procedures (including critical care time)  Labs Reviewed - No data to display No results found.   1. Pain, dental       MDM  30-year-old female with uncomplicated dental pain. I gave the patient an alveolar dental block using the  premixed Marcaine/epinephrine solution, will discharge the patient with penicillin and Norco. Patient understands and agrees with the plan. She will followup with a dentist tomorrow.        Roxy Horseman, PA-C 05/04/12 [phone]

## 2012-09-06 ENCOUNTER — Emergency Department (HOSPITAL_COMMUNITY)
Admission: EM | Admit: 2012-09-06 | Discharge: 2012-09-06 | Disposition: A | Discharge status: Home / Self Care | Payer: Self-pay | Attending: Emergency Medicine | Admitting: Emergency Medicine

## 2012-09-06 ENCOUNTER — Encounter (HOSPITAL_COMMUNITY): Payer: Self-pay | Admitting: Emergency Medicine

## 2012-09-06 DIAGNOSIS — T7491XA Unspecified adult maltreatment, confirmed, initial encounter: Secondary | ICD-10-CM | POA: Insufficient documentation

## 2012-09-06 DIAGNOSIS — M5416 Radiculopathy, lumbar region: Secondary | ICD-10-CM

## 2012-09-06 DIAGNOSIS — M542 Cervicalgia: Secondary | ICD-10-CM

## 2012-09-06 DIAGNOSIS — T7492XA Unspecified child maltreatment, confirmed, initial encounter: Secondary | ICD-10-CM | POA: Insufficient documentation

## 2012-09-06 DIAGNOSIS — Z79899 Other long term (current) drug therapy: Secondary | ICD-10-CM | POA: Insufficient documentation

## 2012-09-06 DIAGNOSIS — S199XXA Unspecified injury of neck, initial encounter: Secondary | ICD-10-CM | POA: Insufficient documentation

## 2012-09-06 DIAGNOSIS — Z872 Personal history of diseases of the skin and subcutaneous tissue: Secondary | ICD-10-CM | POA: Insufficient documentation

## 2012-09-06 DIAGNOSIS — J029 Acute pharyngitis, unspecified: Secondary | ICD-10-CM | POA: Insufficient documentation

## 2012-09-06 DIAGNOSIS — IMO0002 Reserved for concepts with insufficient information to code with codable children: Secondary | ICD-10-CM | POA: Insufficient documentation

## 2012-09-06 DIAGNOSIS — F17200 Nicotine dependence, unspecified, uncomplicated: Secondary | ICD-10-CM | POA: Insufficient documentation

## 2012-09-06 DIAGNOSIS — S0993XA Unspecified injury of face, initial encounter: Secondary | ICD-10-CM | POA: Insufficient documentation

## 2012-09-06 DIAGNOSIS — T71163A Asphyxiation due to hanging, assault, initial encounter: Secondary | ICD-10-CM | POA: Insufficient documentation

## 2012-09-06 MED ORDER — HYDROCODONE-ACETAMINOPHEN 5-325 MG PO TABS
1.0000 | ORAL_TABLET | Freq: Once | ORAL | Status: AC
  Administered 2012-09-06: 1 via ORAL
  Filled 2012-09-06: qty 1

## 2012-09-06 MED ORDER — IBUPROFEN 400 MG PO TABS
800.0000 mg | ORAL_TABLET | Freq: Once | ORAL | Status: AC
  Administered 2012-09-06: 800 mg via ORAL
  Filled 2012-09-06: qty 2

## 2012-09-06 MED ORDER — HYDROCODONE-ACETAMINOPHEN 5-325 MG PO TABS: ORAL_TABLET | ORAL | Status: DC

## 2012-09-06 NOTE — ED Notes (Signed)
PT ambulated with baseline gait; VSS; A&Ox3; no signs of distress; respirations even and unlabored; skin warm and dry; no questions upon discharge.  

## 2012-09-06 NOTE — ED Notes (Signed)
Pt stated, I've had back pain and a sore throat since last weekend.
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
## 2012-09-07 NOTE — ED Provider Notes (Signed)
Medical screening examination/treatment/procedure(s) were performed by non-physician practitioner and as supervising physician I was immediately available for consultation/collaboration.   Laray Anger, DO 09/07/12 1019

## 2012-09-13 ENCOUNTER — Inpatient Hospital Stay: Payer: Self-pay

## 2013-01-21 ENCOUNTER — Encounter (HOSPITAL_COMMUNITY): Payer: Self-pay | Admitting: Emergency Medicine

## 2013-01-21 ENCOUNTER — Emergency Department (HOSPITAL_COMMUNITY)
Admission: EM | Admit: 2013-01-21 | Discharge: 2013-01-21 | Disposition: A | Discharge status: Home / Self Care | Payer: Medicaid Other | Source: Home / Self Care | Attending: Emergency Medicine | Admitting: Emergency Medicine

## 2013-01-21 ENCOUNTER — Emergency Department (INDEPENDENT_AMBULATORY_CARE_PROVIDER_SITE_OTHER): Payer: Medicaid Other

## 2013-01-21 DIAGNOSIS — M25469 Effusion, unspecified knee: Secondary | ICD-10-CM

## 2013-01-21 DIAGNOSIS — M25561 Pain in right knee: Secondary | ICD-10-CM

## 2013-01-21 DIAGNOSIS — M25461 Effusion, right knee: Secondary | ICD-10-CM

## 2013-01-21 DIAGNOSIS — M25569 Pain in unspecified knee: Secondary | ICD-10-CM

## 2013-01-21 MED ORDER — MELOXICAM 7.5 MG PO TABS: 15.0000 mg | ORAL_TABLET | Freq: Every day | ORAL | Status: DC

## 2013-01-21 MED ORDER — KETOROLAC TROMETHAMINE 60 MG/2ML IM SOLN
60.0000 mg | Freq: Once | INTRAMUSCULAR | Status: AC
  Administered 2013-01-21: 60 mg via INTRAMUSCULAR

## 2013-01-21 MED ORDER — KETOROLAC TROMETHAMINE 60 MG/2ML IM SOLN
INTRAMUSCULAR | Status: AC
  Filled 2013-01-21: qty 2

## 2013-01-21 MED ORDER — TRAMADOL HCL 50 MG PO TABS: 50.0000 mg | ORAL_TABLET | Freq: Four times a day (QID) | ORAL | Status: DC | PRN

## 2013-01-21 NOTE — ED Provider Notes (Signed)
Medical screening examination/treatment/procedure(s) were performed by non-physician practitioner and as supervising physician I was immediately available for consultation/collaboration.  Leslee Home, M.D.  Reuben Likes, MD 01/21/13 1350
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
## 2013-01-21 NOTE — ED Provider Notes (Signed)
CSN: [id number]     Arrival date & time 01/21/13  1107 History   First MD Initiated Contact with Patient 01/21/13 1117     Chief Complaint  Patient presents with  . Knee Pain   (Consider location/radiation/quality/duration/timing/severity/associated sxs/prior Treatment) Patient is a 42 y.o. female presenting with knee pain. The history is provided by the patient.  Knee Pain Location:  Knee Time since incident:  3 months Lower extremity injury: Patient injured knee over the summer.  States has never fully healed, but has been severely uncomfortable for 1 month.     Knee location:  R knee Pain details:    Quality:  Aching   Radiates to:  Does not radiate   Severity:  Moderate   Onset quality:  Gradual   Progression:  Worsening Worsened by:  Bearing weight and activity Ineffective treatments:  NSAIDs Associated symptoms: stiffness   Associated symptoms: no back pain, no neck pain, no numbness and no tingling   Associated symptoms comment:  Patient states it is "stiff feeling" in the morning for approximately 15 minutes until it "warms up".  The patient describes her discomfort as intermittently achy. States it has been progressively and significantly worse for the past 4 weeks for which she has taken ibuprofen daily.  The patient states that over the summer she had her right leg "propped up "on something patient states that it slipped and her lower extremity hyperextended causing a "popping or pulling" sensation within her right knee.  Patient did not seek treatment. She states that she rested her knee used ibuprofen and ice for approximately one week with significant improvement the states however since injury she has had intermittent discomfort with aching for which she is utilized ibuprofen successfully. However, patient states that over the past 4 weeks her right knee has become significantly more uncomfortable with constant aching requiring daily ibuprofen use. The patient states that her  knee is "stiff" stiff when she first wakes in the morning states that it gradually resolves with movement after approximately 15 minutes. States with continued use and weight-bearing the knee becomes more uncomfortable throughout the day.   Past Medical History  Diagnosis Date  . Eczema    History reviewed. No pertinent past surgical history. No family history on file. History  Substance Use Topics  . Smoking status: Current Every Day Smoker -- 0.50 packs/day  . Smokeless tobacco: Not on file  . Alcohol Use: 0.6 oz/week    1 Shots of liquor per week     Comment: occ   OB History   Grav Para Term Preterm Abortions TAB SAB Ect Mult Living                 Review of Systems  Constitutional: Negative.   HENT: Negative.   Eyes: Negative.   Respiratory: Negative.   Cardiovascular: Negative.   Gastrointestinal: Negative.   Endocrine: Negative.   Genitourinary: Negative.   Musculoskeletal: Positive for joint swelling and stiffness. Negative for arthralgias, back pain, gait problem, myalgias, neck pain and neck stiffness.  Skin: Negative.   Allergic/Immunologic: Negative.   Neurological: Negative.   Hematological: Negative.   Psychiatric/Behavioral: Negative.    Denies personal or family history of autoimmune disorders or rheumatoid arthritis.    Allergies  Banana and Peach  Home Medications   Current Outpatient Rx  Name  Route  Sig  Dispense  Refill  . Acetaminophen-Aspirin Buffered (EXCEDRIN BACK & BODY PO)   Oral   Take 3 tablets by mouth  every 5 (five) hours as needed (for pain).         Marland Kitchen albuterol (PROVENTIL HFA;VENTOLIN HFA) 108 (90 BASE) MCG/ACT inhaler   Inhalation   Inhale 2 puffs into the lungs every 6 (six) hours as needed. For shortness of breath         . HYDROcodone-acetaminophen (NORCO/VICODIN) 5-325 MG per tablet      Take 1-2 tablets by mouth every 6 hours as needed for pain.   15 tablet   0   . meloxicam (MOBIC) 7.5 MG tablet   Oral   Take 2  tablets (15 mg total) by mouth daily.   30 tablet   0   . traMADol (ULTRAM) 50 MG tablet   Oral   Take 1 tablet (50 mg total) by mouth every 6 (six) hours as needed for pain.   15 tablet   0   . triamcinolone cream (KENALOG) 0.1 %   Topical   Apply 1 application topically 2 (two) times daily as needed (for eczema).          BP 107/71  Pulse 67  Temp(Src) 98.2 F (36.8 C) (Oral)  Resp 16  SpO2 99%  LMP 12/30/2012   Physical Exam  Nursing note and vitals reviewed. Constitutional: She appears well-developed and well-nourished. No distress.  Cardiovascular: Normal rate, regular rhythm, normal heart sounds and intact distal pulses.  Exam reveals no gallop and no friction rub.   No murmur heard. Pulmonary/Chest: Effort normal and breath sounds normal. No respiratory distress. She has no wheezes. She has no rales. She exhibits no tenderness.  Musculoskeletal: Normal range of motion. She exhibits edema and tenderness.       Right knee: She exhibits swelling and effusion. She exhibits no ecchymosis, no deformity, no laceration, no erythema, normal alignment, no LCL laxity, normal patellar mobility and no MCL laxity. Tenderness found.       Legs: Patient has full active and passive ROM.  Active ROM is slower due to patient discomfort. Crepitus noted with movement bilaterally. Negative anterior drawer and Lachman test.  Patient reports mild relief of discomfort valgus stress of LCL on McMurray test.  Skin: She is not diaphoretic.   Patient is able to ambulate and bear weight at this time with moderate discomfort.  ED Course  Procedures (including critical care time) Labs Review Labs Reviewed - No data to display Imaging Review Dg Knee Complete 4 Views Right  01/21/2013   CLINICAL DATA:  Initial encounter for an at least 4 month history of right knee pain which the patient relates to a pulling injury at that time.  EXAM: RIGHT KNEE - COMPLETE 4+ VIEW  COMPARISON:  None.  FINDINGS:  No evidence of acute fracture or dislocation. Well-preserved joint spaces. Well-preserved bone mineral density. No intrinsic osseous abnormality. Small to moderate-sized joint effusion.  IMPRESSION: No osseous abnormality.  Small to moderate-sized joint effusion.   Electronically Signed   By: Hulan Saas M.D.   On: 01/21/2013 12:41    MDM   1. Knee pain, right   2. Knee effusion, right    Meds ordered this encounter  Medications  . ketorolac (TORADOL) injection 60 mg    Sig:   . traMADol (ULTRAM) 50 MG tablet    Sig: Take 1 tablet (50 mg total) by mouth every 6 (six) hours as needed for pain.    Dispense:  15 tablet    Refill:  0  . meloxicam (MOBIC) 7.5 MG tablet  Sig: Take 2 tablets (15 mg total) by mouth daily.    Dispense:  30 tablet    Refill:  0   Orders Placed This Encounter  Procedures  . DG Knee Complete 4 Views Right    Standing Status: Standing     Number of Occurrences: 1     Standing Expiration Date:     Order Specific Question:  Reason for exam:    Answer:  KNEE PAIN  . Apply knee sleeve    Standing Status: Standing     Number of Occurrences: 1     Standing Expiration Date:     Order Specific Question:  Laterality    Answer:  Right   Differentials:  Osteoarthritis vs. Rheumatoid Arthritis.  Plan of care discussed with Dr. Lorenz Coaster.  The patient to follow up with orthopedics.  Weber Cooks, NP 01/21/13 1342

## 2013-02-09 NOTE — ED Notes (Signed)
Patient called w questions 

## 2013-02-10 ENCOUNTER — Telehealth (HOSPITAL_COMMUNITY): Payer: Self-pay | Admitting: *Deleted

## 2013-02-10 NOTE — ED Notes (Signed)
Pt. called on VM and asked for the name of the Orthopedist we referred her. I called her back and told her it was Dr. Sherlean Foot.  She said she already found out. Vassie Moselle 02/10/2013

## 2013-02-15 ENCOUNTER — Other Ambulatory Visit: Payer: Self-pay | Admitting: Orthopedic Surgery

## 2013-02-15 DIAGNOSIS — M25561 Pain in right knee: Secondary | ICD-10-CM

## 2013-02-21 ENCOUNTER — Other Ambulatory Visit: Payer: Medicaid Other

## 2013-02-23 ENCOUNTER — Ambulatory Visit
Admission: RE | Admit: 2013-02-23 | Discharge: 2013-02-23 | Disposition: A | Discharge status: Home / Self Care | Payer: Medicaid Other | Source: Ambulatory Visit | Attending: Orthopedic Surgery | Admitting: Orthopedic Surgery

## 2013-02-23 DIAGNOSIS — M25561 Pain in right knee: Secondary | ICD-10-CM

## 2013-07-12 ENCOUNTER — Other Ambulatory Visit (HOSPITAL_COMMUNITY)
Admission: RE | Admit: 2013-07-12 | Discharge: 2013-07-12 | Disposition: A | Discharge status: Home / Self Care | Payer: Medicaid Other | Source: Ambulatory Visit | Attending: Family Medicine | Admitting: Family Medicine

## 2013-07-12 ENCOUNTER — Emergency Department (INDEPENDENT_AMBULATORY_CARE_PROVIDER_SITE_OTHER)
Admission: EM | Admit: 2013-07-12 | Discharge: 2013-07-12 | Disposition: A | Discharge status: Home / Self Care | Payer: Medicaid Other | Source: Home / Self Care | Attending: Family Medicine | Admitting: Family Medicine

## 2013-07-12 ENCOUNTER — Encounter (HOSPITAL_COMMUNITY): Payer: Self-pay | Admitting: Emergency Medicine

## 2013-07-12 DIAGNOSIS — N760 Acute vaginitis: Secondary | ICD-10-CM

## 2013-07-12 DIAGNOSIS — Z113 Encounter for screening for infections with a predominantly sexual mode of transmission: Secondary | ICD-10-CM | POA: Insufficient documentation

## 2013-07-12 LAB — POCT URINALYSIS DIP (DEVICE)
BILIRUBIN URINE: NEGATIVE
GLUCOSE, UA: NEGATIVE mg/dL
KETONES UR: NEGATIVE mg/dL
Leukocytes, UA: NEGATIVE
NITRITE: NEGATIVE
PH: 5.5 (ref 5.0–8.0)
Protein, ur: NEGATIVE mg/dL
Urobilinogen, UA: 0.2 mg/dL (ref 0.0–1.0)

## 2013-07-12 LAB — POCT PREGNANCY, URINE: Preg Test, Ur: NEGATIVE

## 2013-07-12 MED ORDER — METRONIDAZOLE 500 MG PO TABS: 500.0000 mg | ORAL_TABLET | Freq: Two times a day (BID) | ORAL | Status: DC

## 2013-07-12 NOTE — Discharge Instructions (Signed)
Thank you for coming in today. Take metronidazole twice daily for one week.  If your belly pain worsens, or you have high fever, bad vomiting, blood in your stool or black tarry stool go to the Emergency Room.   Bacterial Vaginosis Bacterial vaginosis is a vaginal infection that occurs when the normal balance of bacteria in the vagina is disrupted. It results from an overgrowth of certain bacteria. This is the most common vaginal infection in women of childbearing age. Treatment is important to prevent complications, especially in pregnant women, as it can cause a premature delivery. CAUSES  Bacterial vaginosis is caused by an increase in harmful bacteria that are normally present in smaller amounts in the vagina. Several different kinds of bacteria can cause bacterial vaginosis. However, the reason that the condition develops is not fully understood. RISK FACTORS Certain activities or behaviors can put you at an increased risk of developing bacterial vaginosis, including:  Having a new sex partner or multiple sex partners.  Douching.  Using an intrauterine device (IUD) for contraception. Women do not get bacterial vaginosis from toilet seats, bedding, swimming pools, or contact with objects around them. SIGNS AND SYMPTOMS  Some women with bacterial vaginosis have no signs or symptoms. Common symptoms include:  Grey vaginal discharge.  A fishlike odor with discharge, especially after sexual intercourse.  Itching or burning of the vagina and vulva.  Burning or pain with urination. DIAGNOSIS  Your health care provider will take a medical history and examine the vagina for signs of bacterial vaginosis. A sample of vaginal fluid may be taken. Your health care provider will look at this sample under a microscope to check for bacteria and abnormal cells. A vaginal pH test may also be done.  TREATMENT  Bacterial vaginosis may be treated with antibiotic medicines. These may be given in the form  of a pill or a vaginal cream. A second round of antibiotics may be prescribed if the condition comes back after treatment.  HOME CARE INSTRUCTIONS   Only take over-the-counter or prescription medicines as directed by your health care provider.  If antibiotic medicine was prescribed, take it as directed. Make sure you finish it even if you start to feel better.  Do not have sex until treatment is completed.  Tell all sexual partners that you have a vaginal infection. They should see their health care provider and be treated if they have problems, such as a mild rash or itching.  Practice safe sex by using condoms and only having one sex partner. SEEK MEDICAL CARE IF:   Your symptoms are not improving after 3 days of treatment.  You have increased discharge or pain.  You have a fever. MAKE SURE YOU:   Understand these instructions.  Will watch your condition.  Will get help right away if you are not doing well or get worse. FOR MORE INFORMATION  Centers for Disease Control and Prevention, Division of STD Prevention: SolutionApps.co.zawww.cdc.gov/std American Sexual Health Association (ASHA): www.ashastd.org  Document Released: 03/09/2005 Document Revised: 12/28/2012 Document Reviewed: 10/19/2012 [facility] Patient Information 2014 [employer], [place].

## 2013-07-12 NOTE — ED Provider Notes (Signed)
Kylie Nicholson is a 43 y.o. female who presents to Urgent Care today for vaginal itching. Patient was possibly exposed to trichomonas recently. She developed vaginal itching. She denies any fevers chills nausea vomiting or diarrhea. She denies significant vaginal discharge. She feels well otherwise. No dysuria. No significant abdominal pain.   Past Medical History  Diagnosis Date  . Eczema    History  Substance Use Topics  . Smoking status: Current Every Day Smoker -- 0.50 packs/day  . Smokeless tobacco: Not on file  . Alcohol Use: 0.6 oz/week    1 Shots of liquor per week     Comment: occ   ROS as above Medications: No current facility-administered medications for this encounter.   Current Outpatient Prescriptions  Medication Sig Dispense Refill  . Acetaminophen-Aspirin Buffered (EXCEDRIN BACK & BODY PO) Take 3 tablets by mouth every 5 (five) hours as needed (for pain).      Marland Kitchen. albuterol (PROVENTIL HFA;VENTOLIN HFA) 108 (90 BASE) MCG/ACT inhaler Inhale 2 puffs into the lungs every 6 (six) hours as needed. For shortness of breath      . metroNIDAZOLE (FLAGYL) 500 MG tablet Take 1 tablet (500 mg total) by mouth 2 (two) times daily.  14 tablet  0    Exam:  BP 126/73  Pulse 68  Temp(Src) 98.5 F (36.9 C) (Oral)  Resp 18  SpO2 100%  LMP 07/10/2013 Gen: Well NAD HEENT: EOMI,  MMM Lungs: Normal work of breathing. CTABL Heart: RRR no MRG Abd: NABS, Soft. NT, ND Exts: Brisk capillary refill, warm and well perfused.  GYN : Normal external genitalia. Vaginal canal with old menstrual blood in the vault. Cervix normal appearing and nontender.  Results for orders placed during the hospital encounter of 07/12/13 (from the past 24 hour(s))  POCT URINALYSIS DIP (DEVICE)     Status: Abnormal   Collection Time    07/12/13  7:47 PM      Result Value Ref Range   Glucose, UA NEGATIVE  NEGATIVE mg/dL   Bilirubin Urine NEGATIVE  NEGATIVE   Ketones, ur NEGATIVE  NEGATIVE mg/dL   Specific  Gravity, Urine >=1.030  1.005 - 1.030   Hgb urine dipstick TRACE (*) NEGATIVE   pH 5.5  5.0 - 8.0   Protein, ur NEGATIVE  NEGATIVE mg/dL   Urobilinogen, UA 0.2  0.0 - 1.0 mg/dL   Nitrite NEGATIVE  NEGATIVE   Leukocytes, UA NEGATIVE  NEGATIVE  POCT PREGNANCY, URINE     Status: None   Collection Time    07/12/13  7:50 PM      Result Value Ref Range   Preg Test, Ur NEGATIVE  NEGATIVE   No results found.  Assessment and Plan: 43 y.o. female with possible trichomonas exposure with vaginal itching. Empiric treatment with metronidazole. Followup with primary care provider. Patient refused HIV and RPR testing. Cytology pending.  Discussed warning signs or symptoms. Please see discharge instructions. Patient expresses understanding.    Rodolph BongEvan S Rosabel Sermeno, MD 07/12/13 2034

## 2013-07-12 NOTE — ED Notes (Signed)
Pt reports poss exposure to STD/Trich Sx today include left flank pain Denies urinary sx, f/v/n/d Alert w/no signs of acute distress.

## 2013-07-12 NOTE — ED Notes (Signed)
Pt reports she did not want HIV/RPR testing States she will have a CPE next week w/PCP and will have the testing then.  Call back number verified

## 2013-07-13 LAB — CERVICOVAGINAL ANCILLARY ONLY
CHLAMYDIA, DNA PROBE: NEGATIVE
NEISSERIA GONORRHEA: NEGATIVE
WET PREP (BD AFFIRM): NEGATIVE
WET PREP (BD AFFIRM): NEGATIVE
WET PREP (BD AFFIRM): POSITIVE — AB

## 2013-07-13 NOTE — ED Notes (Signed)
GC/Chlamydia neg., Affirm: Gardnerella pos., Candida and Trich neg.  Pt. adequately treated with Flagyl. Desiree LucySuzanne M [facility] 07/13/2013

## 2013-07-20 ENCOUNTER — Emergency Department (HOSPITAL_COMMUNITY): Payer: Medicaid Other

## 2013-07-20 ENCOUNTER — Emergency Department (HOSPITAL_COMMUNITY)
Admission: EM | Admit: 2013-07-20 | Discharge: 2013-07-20 | Disposition: A | Discharge status: Home / Self Care | Payer: Medicaid Other | Attending: Emergency Medicine | Admitting: Emergency Medicine

## 2013-07-20 ENCOUNTER — Encounter (HOSPITAL_COMMUNITY): Payer: Self-pay | Admitting: Emergency Medicine

## 2013-07-20 DIAGNOSIS — Z872 Personal history of diseases of the skin and subcutaneous tissue: Secondary | ICD-10-CM | POA: Insufficient documentation

## 2013-07-20 DIAGNOSIS — Z79899 Other long term (current) drug therapy: Secondary | ICD-10-CM | POA: Insufficient documentation

## 2013-07-20 DIAGNOSIS — J9801 Acute bronchospasm: Secondary | ICD-10-CM

## 2013-07-20 DIAGNOSIS — F17200 Nicotine dependence, unspecified, uncomplicated: Secondary | ICD-10-CM | POA: Insufficient documentation

## 2013-07-20 DIAGNOSIS — J45901 Unspecified asthma with (acute) exacerbation: Secondary | ICD-10-CM | POA: Insufficient documentation

## 2013-07-20 DIAGNOSIS — J40 Bronchitis, not specified as acute or chronic: Secondary | ICD-10-CM

## 2013-07-20 MED ORDER — PREDNISONE 20 MG PO TABS
60.0000 mg | ORAL_TABLET | Freq: Once | ORAL | Status: AC
  Administered 2013-07-20: 60 mg via ORAL
  Filled 2013-07-20: qty 3

## 2013-07-20 MED ORDER — ALBUTEROL SULFATE (2.5 MG/3ML) 0.083% IN NEBU
5.0000 mg | INHALATION_SOLUTION | Freq: Once | RESPIRATORY_TRACT | Status: AC
  Administered 2013-07-20: 5 mg via RESPIRATORY_TRACT
  Filled 2013-07-20: qty 6

## 2013-07-20 MED ORDER — IPRATROPIUM BROMIDE 0.02 % IN SOLN
0.5000 mg | Freq: Once | RESPIRATORY_TRACT | Status: AC
  Administered 2013-07-20: 0.5 mg via RESPIRATORY_TRACT
  Filled 2013-07-20: qty 2.5

## 2013-07-20 MED ORDER — BENZONATATE 100 MG PO CAPS: 100.0000 mg | ORAL_CAPSULE | Freq: Three times a day (TID) | ORAL | Status: DC

## 2013-07-20 MED ORDER — ALBUTEROL SULFATE (2.5 MG/3ML) 0.083% IN NEBU
5.0000 mg | INHALATION_SOLUTION | Freq: Once | RESPIRATORY_TRACT | Status: AC
Start: 2013-07-20 — End: 2013-07-20
  Administered 2013-07-20: 5 mg via RESPIRATORY_TRACT
  Filled 2013-07-20: qty 6

## 2013-07-20 MED ORDER — ALBUTEROL SULFATE (2.5 MG/3ML) 0.083% IN NEBU: 5.0000 mg | INHALATION_SOLUTION | Freq: Once | RESPIRATORY_TRACT | Status: DC

## 2013-07-20 MED ORDER — PREDNISONE 50 MG PO TABS
50.0000 mg | ORAL_TABLET | Freq: Every day | ORAL | Status: DC
Start: 2013-07-20 — End: 2015-12-28

## 2013-07-20 NOTE — ED Provider Notes (Signed)
Medical screening examination/treatment/procedure(s) were performed by non-physician practitioner and as supervising physician I was immediately available for consultation/collaboration.   EKG Interpretation None        Kylie B. Bernette MayersSheldon, MD 07/20/13 [phone]

## 2013-07-20 NOTE — ED Provider Notes (Signed)
CSN: [id number]     Arrival date & time 07/20/13  [id number] History   First MD Initiated Contact with Patient 07/20/13 1109     Chief Complaint  Patient presents with  . Asthma     (Consider location/radiation/quality/duration/timing/severity/associated sxs/prior Treatment) HPI Comments: Patient is a 43 year old female who presents to the emergency department complaining of shortness of breath and wheezing x3 days. Patient states this feels like when she had an asthma exacerbation in the past and was given an inhaler by her primary care physician, however has never had an official diagnosis. Admits to associated nonproductive cough. Denies chest pain, fever, chills, nausea or vomiting. She does not have an inhaler at home. She has tried taking over-the-counter cold medication with no relief.  The history is provided by the patient.    Past Medical History  Diagnosis Date  . Eczema    History reviewed. No pertinent past surgical history. No family history on file. History  Substance Use Topics  . Smoking status: Current Every Day Smoker -- 0.50 packs/day  . Smokeless tobacco: Not on file  . Alcohol Use: 0.6 oz/week    1 Shots of liquor per week     Comment: occ   OB History   Grav Para Term Preterm Abortions TAB SAB Ect Mult Living                 Review of Systems  Respiratory: Positive for cough, shortness of breath and wheezing.   All other systems reviewed and are negative.     Allergies  Banana and Peach  Home Medications   Prior to Admission medications   Medication Sig Start Date End Date Taking? Authorizing Provider  albuterol (PROVENTIL HFA;VENTOLIN HFA) 108 (90 BASE) MCG/ACT inhaler Inhale 2 puffs into the lungs every 6 (six) hours as needed. For shortness of breath    Historical Provider, MD   BP 123/63  Pulse 112  Temp(Src) 97.8 F (36.6 C) (Oral)  Resp 31  SpO2 100%  LMP 07/10/2013 Physical Exam  Nursing note and vitals reviewed. Constitutional: She  is oriented to person, place, and time. She appears well-developed and well-nourished. No distress.  HENT:  Head: Normocephalic and atraumatic.  Mouth/Throat: Oropharynx is clear and moist.  Eyes: Conjunctivae are normal.  Neck: Normal range of motion. Neck supple. No JVD present.  Cardiovascular: Normal rate, regular rhythm and normal heart sounds.   Pulmonary/Chest: Effort normal. No respiratory distress (scattered bilateral). She has wheezes.  Musculoskeletal: Normal range of motion. She exhibits no edema.  Neurological: She is alert and oriented to person, place, and time.  Skin: Skin is warm and dry. She is not diaphoretic.  Psychiatric: She has a normal mood and affect. Her behavior is normal.    ED Course  Procedures (including critical care time) Labs Review Labs Reviewed - No data to display  Imaging Review Dg Chest 2 View (if Patient Has Fever And/or Copd)  07/20/2013   CLINICAL DATA:  Shortness of breath, cough, asthma, smoker  EXAM: CHEST  2 VIEW  COMPARISON:  DG CHEST 2 VIEW dated 04/28/2011  FINDINGS: There are bilateral bronchitic changes, likely chronic which are unchanged in the prior exam. There is no focal parenchymal opacity, pleural effusion, or pneumothorax. The heart and mediastinal contours are unremarkable.  The osseous structures are unremarkable.  IMPRESSION: No active cardiopulmonary disease.   Electronically Signed   By: Elige KoHetal  Patel   On: 07/20/2013 10:35     EKG Interpretation None  MDM   Final diagnoses:  Bronchospasm  Bronchitis    Patient presenting with cough, wheezing and shortness of breath. She is well appearing and in no apparent distress, afebrile, O2 sat 99% on room air. She received an albuterol neb treatment in triage and had a normal chest x-ray, no improvement with neb treatment. Plan to give DuoNeb, prednisone and re-assess. 12:28 PM Pt reports some improvement of her symptoms after DuoNeb and prednisone. On repeat lung exam, she  is moving air much better. Stable for d/c, will prescribe albuterol inhaler, prednisone burst and tessalon. Resources for followup given. Return precautions given. Patient states understanding of treatment care plan and is agreeable.    Trevor MaceRobyn M Albert, PA-C 07/20/13 1231

## 2013-07-20 NOTE — Discharge Instructions (Signed)
Use inhaler every 4-6 hours as needed for wheezing. Followup with the wellness clinic to establish care with a primary care physician. Take prednisone as prescribed beginning tomorrow as directed as you were given her first dose in the emergency department today. Use Tessalon as instructed for cough.  Bronchitis Bronchitis is inflammation of the airways that extend from the windpipe into the lungs (bronchi). The inflammation often causes mucus to develop, which leads to a cough. If the inflammation becomes severe, it may cause shortness of breath. CAUSES  Bronchitis may be caused by:   Viral infections.   Bacteria.   Cigarette smoke.   Allergens, pollutants, and other irritants.  SIGNS AND SYMPTOMS  The most common symptom of bronchitis is a frequent cough that produces mucus. Other symptoms include:  Fever.   Body aches.   Chest congestion.   Chills.   Shortness of breath.   Sore throat.  DIAGNOSIS  Bronchitis is usually diagnosed through a medical history and physical exam. Tests, such as chest X-rays, are sometimes done to rule out other conditions.  TREATMENT  You may need to avoid contact with whatever caused the problem (smoking, for example). Medicines are sometimes needed. These may include:  Antibiotics. These may be prescribed if the condition is caused by bacteria.  Cough suppressants. These may be prescribed for relief of cough symptoms.   Inhaled medicines. These may be prescribed to help open your airways and make it easier for you to breathe.   Steroid medicines. These may be prescribed for those with recurrent (chronic) bronchitis. HOME CARE INSTRUCTIONS  Get plenty of rest.   Drink enough fluids to keep your urine clear or pale yellow (unless you have a medical condition that requires fluid restriction). Increasing fluids may help thin your secretions and will prevent dehydration.   Only take over-the-counter or prescription medicines as  directed by your health care provider.  Only take antibiotics as directed. Make sure you finish them even if you start to feel better.  Avoid secondhand smoke, irritating chemicals, and strong fumes. These will make bronchitis worse. If you are a smoker, quit smoking. Consider using nicotine gum or skin patches to help control withdrawal symptoms. Quitting smoking will help your lungs heal faster.   Put a cool-mist humidifier in your bedroom at night to moisten the air. This may help loosen mucus. Change the water in the humidifier daily. You can also run the hot water in your shower and sit in the bathroom with the door closed for 5 10 minutes.   Follow up with your health care provider as directed.   Wash your hands frequently to avoid catching bronchitis again or spreading an infection to others.  SEEK MEDICAL CARE IF: Your symptoms do not improve after 1 week of treatment.  SEEK IMMEDIATE MEDICAL CARE IF:  Your fever increases.  You have chills.   You have chest pain.   You have worsening shortness of breath.   You have bloody sputum.  You faint.  You have lightheadedness.  You have a severe headache.   You vomit repeatedly. MAKE SURE YOU:   Understand these instructions.  Will watch your condition.  Will get help right away if you are not doing well or get worse. Document Released: 03/09/2005 Document Revised: 12/28/2012 Document Reviewed: 11/01/2012 [facility] Patient Information 2014 [place], [place].  Bronchospasm, Adult A bronchospasm is a spasm or tightening of the airways going into the lungs. During a bronchospasm breathing becomes more difficult because the airways get  smaller. When this happens there can be coughing, a whistling sound when breathing (wheezing), and difficulty breathing. Bronchospasm is often associated with asthma, but not all patients who experience a bronchospasm have asthma. CAUSES  A bronchospasm is caused by inflammation or  irritation of the airways. The inflammation or irritation may be triggered by:   Allergies (such as to animals, pollen, food, or mold). Allergens that cause bronchospasm may cause wheezing immediately after exposure or many hours later.   Infection. Viral infections are believed to be the most common cause of bronchospasm.   Exercise.   Irritants (such as pollution, cigarette smoke, strong odors, aerosol sprays, and paint fumes).   Weather changes. Winds increase molds and pollens in the air. Rain refreshes the air by washing irritants out. Cold air may cause inflammation.   Stress and emotional upset.  SIGNS AND SYMPTOMS   Wheezing.   Excessive nighttime coughing.   Frequent or severe coughing with a simple cold.   Chest tightness.   Shortness of breath.  DIAGNOSIS  Bronchospasm is usually diagnosed through a history and physical exam. Tests, such as chest X-rays, are sometimes done to look for other conditions. TREATMENT   Inhaled medicines can be given to open up your airways and help you breathe. The medicines can be given using either an inhaler or a nebulizer machine.  Corticosteroid medicines may be given for severe bronchospasm, usually when it is associated with asthma. HOME CARE INSTRUCTIONS   Always have a plan prepared for seeking medical care. Know when to call your health care provider and local emergency services (911 in the [place] Know where you can access local emergency care.  Only take medicines as directed by your health care provider.  If you were prescribed an inhaler or nebulizer machine, ask your health care provider to explain how to use it correctly. Always use a spacer with your inhaler if you were given one.  It is necessary to remain calm during an attack. Try to relax and breathe more slowly.  Control your home environment in the following ways:   Change your heating and air conditioning filter at least once a month.   Limit  your use of fireplaces and wood stoves.  Do not smoke and do not allow smoking in your home.   Avoid exposure to perfumes and fragrances.   Get rid of pests (such as roaches and mice) and their droppings.   Throw away plants if you see mold on them.   Keep your house clean and dust free.   Replace carpet with wood, tile, or vinyl flooring. Carpet can trap dander and dust.   Use allergy-proof pillows, mattress covers, and box spring covers.   Wash bed sheets and blankets every week in hot water and dry them in a dryer.   Use blankets that are made of polyester or cotton.   Wash hands frequently. SEEK MEDICAL CARE IF:   You have muscle aches.   You have chest pain.   The sputum changes from clear or white to yellow, green, gray, or bloody.   The sputum you cough up gets thicker.   There are problems that may be related to the medicine you are given, such as a rash, itching, swelling, or trouble breathing.  SEEK IMMEDIATE MEDICAL CARE IF:   You have worsening wheezing and coughing even after taking your prescribed medicines.   You have increased difficulty breathing.   You develop severe chest pain. MAKE SURE YOU:   Understand  these instructions.  Will watch your condition.  Will get help right away if you are not doing well or get worse. Document Released: 03/12/2003 Document Revised: 11/09/2012 Document Reviewed: 08/29/2012 [facility] Patient Information 2014 [place], [place].

## 2013-07-20 NOTE — ED Notes (Signed)
Pt reports asthma exacerbation 3 days ago.  Pt states she does not currently have an inhaler.

## 2013-07-20 NOTE — ED Notes (Signed)
Patient reports shortness of breath for 3 days as well as a non-productive cough.  Has had an inhaler in the past but was never formally diagnosed with asthma.  Patient reports that she ate some casseroles with a broken glass dish.  She states she spit it out once she realized it but then swallowed and noticed a gritty sensation.  Patient states she feels a slight pain in her throat.

## 2014-05-02 ENCOUNTER — Emergency Department (HOSPITAL_COMMUNITY)
Admission: EM | Admit: 2014-05-02 | Discharge: 2014-05-02 | Disposition: A | Discharge status: Home / Self Care | Payer: Medicaid Other | Source: Home / Self Care | Attending: Family Medicine | Admitting: Family Medicine

## 2014-05-02 ENCOUNTER — Encounter (HOSPITAL_COMMUNITY): Payer: Self-pay

## 2014-05-02 DIAGNOSIS — S46811A Strain of other muscles, fascia and tendons at shoulder and upper arm level, right arm, initial encounter: Secondary | ICD-10-CM

## 2014-05-02 MED ORDER — IBUPROFEN 600 MG PO TABS: 600.0000 mg | ORAL_TABLET | Freq: Four times a day (QID) | ORAL | Status: DC | PRN

## 2014-05-02 MED ORDER — CYCLOBENZAPRINE HCL 5 MG PO TABS: 5.0000 mg | ORAL_TABLET | Freq: Three times a day (TID) | ORAL | Status: DC | PRN
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
## 2014-05-02 NOTE — ED Provider Notes (Signed)
CSN: [id number]     Arrival date & time 05/02/14  0802 History   First MD Initiated Contact with Patient 05/02/14 [phone]     Chief Complaint  Patient presents with  . Neck Pain   (Consider location/radiation/quality/duration/timing/severity/associated sxs/prior Treatment) HPI Comments: 44 y/o right hand dominant female who works in [employer] presents with 7-10 day history right lateral neck and right posterior shoulder pain that is exacerbated by movement of RUE. No reports of injury. Using occasional doses of ibuprofen at home with limited relief.   Patient is a 44 y.o. female presenting with neck pain. The history is provided by the patient.  Neck Pain   Past Medical History  Diagnosis Date  . Eczema    History reviewed. No pertinent past surgical history. History reviewed. No pertinent family history. History  Substance Use Topics  . Smoking status: Current Every Day Smoker -- 0.50 packs/day  . Smokeless tobacco: Not on file  . Alcohol Use: 0.6 oz/week    1 Shots of liquor per week     Comment: occ   OB History    No data available     Review of Systems  Musculoskeletal: Positive for neck pain.  All other systems reviewed and are negative.   Allergies  Banana and Peach  Home Medications   Prior to Admission medications   Medication Sig Start Date End Date Taking? Authorizing Provider  albuterol (PROVENTIL HFA;VENTOLIN HFA) 108 (90 BASE) MCG/ACT inhaler Inhale 2 puffs into the lungs every 6 (six) hours as needed. For shortness of breath    Historical Provider, MD  benzonatate (TESSALON) 100 MG capsule Take 1 capsule (100 mg total) by mouth every 8 (eight) hours. 07/20/13   Robyn M Hess, PA-C  cyclobenzaprine (FLEXERIL) 5 MG tablet Take 1 tablet (5 mg total) by mouth 3 (three) times daily as needed for muscle spasms. Do not drive or operate machinery if taking this medication 05/02/14   Ria Clock, PA  ibuprofen (ADVIL,MOTRIN) 600 MG tablet Take 1 tablet  (600 mg total) by mouth every 6 (six) hours as needed for mild pain or moderate pain. 05/02/14   Mathis Fare Presson, PA  Phenylephrine-DM-GG-APAP (TYLENOL COLD/FLU SEVERE PO) Take 2 tablets by mouth every 4 (four) hours as needed (cold symptoms).    Historical Provider, MD  predniSONE (DELTASONE) 50 MG tablet Take 1 tablet (50 mg total) by mouth daily. 07/20/13   Robyn M Hess, PA-C   BP 128/71 mmHg  Pulse 64  Temp(Src) 97.8 F (36.6 C) (Oral)  Resp 16  SpO2 100% Physical Exam  Constitutional: She is oriented to person, place, and time. She appears well-developed and well-nourished. No distress.  Cardiovascular: Normal rate, regular rhythm and normal heart sounds.   Musculoskeletal: Normal range of motion. She exhibits tenderness.       Right shoulder: She exhibits tenderness. She exhibits normal range of motion, no bony tenderness, no swelling, no effusion, no crepitus, no deformity, no laceration, normal pulse and normal strength.       Arms: Tenderness along body and ridge of right trapezius  Neurological: She is alert and oriented to person, place, and time.  Skin: Skin is warm and dry.  Psychiatric: She has a normal mood and affect. Her behavior is normal.  Nursing note and vitals reviewed.   ED Course  Procedures (including critical care time) Labs Review Labs Reviewed - No data to display  Imaging Review No results found.   MDM   1.  Trapezius strain, right, initial encounter   Flexeril and ibuprofen as directed with follow up if no improvement.     Ria ClockJennifer Lee H Presson, [place]PA 05/02/14 1002

## 2014-05-02 NOTE — Discharge Instructions (Signed)
Muscle Strain °A muscle strain is an injury that occurs when a muscle is stretched beyond its normal length. Usually a small number of muscle fibers are torn when this happens. Muscle strain is rated in degrees. First-degree strains have the least amount of muscle fiber tearing and pain. Second-degree and third-degree strains have increasingly more tearing and pain.  °Usually, recovery from muscle strain takes 1-2 weeks. Complete healing takes 5-6 weeks.  °CAUSES  °Muscle strain happens when a sudden, violent force placed on a muscle stretches it too far. This may occur with lifting, sports, or a fall.  °RISK FACTORS °Muscle strain is especially common in athletes.  °SIGNS AND SYMPTOMS °At the site of the muscle strain, there may be: °· Pain. °· Bruising. °· Swelling. °· Difficulty using the muscle due to pain or lack of normal function. °DIAGNOSIS  °Your health care provider will perform a physical exam and ask about your medical history. °TREATMENT  °Often, the best treatment for a muscle strain is resting, icing, and applying cold compresses to the injured area.   °HOME CARE INSTRUCTIONS  °· Use the PRICE method of treatment to promote muscle healing during the first 2-3 days after your injury. The PRICE method involves: °· Protecting the muscle from being injured again. °· Restricting your activity and resting the injured body part. °· Icing your injury. To do this, put ice in a plastic bag. Place a towel between your skin and the bag. Then, apply the ice and leave it on from 15-20 minutes each hour. After the third day, switch to moist heat packs. °· Apply compression to the injured area with a splint or elastic bandage. Be careful not to wrap it too tightly. This may interfere with blood circulation or increase swelling. °· Elevate the injured body part above the level of your heart as often as you can. °· Only take over-the-counter or prescription medicines for pain, discomfort, or fever as directed by your  health care provider. °· Warming up prior to exercise helps to prevent future muscle strains. °SEEK MEDICAL CARE IF:  °· You have increasing pain or swelling in the injured area. °· You have numbness, tingling, or a significant loss of strength in the injured area. °MAKE SURE YOU:  °· Understand these instructions. °· Will watch your condition. °· Will get help right away if you are not doing well or get worse. °Document Released: 03/09/2005 Document Revised: 12/28/2012 Document Reviewed: 10/06/2012 °Exit[employer]® Patient Information ©2015 [employer]. This information is not intended to replace advice given to you by your health care provider. Make sure you discuss any questions you have with your health care provider. ° °Shoulder Sprain °A shoulder sprain is the result of damage to the tough, fiber-like tissues (ligaments) that help hold your shoulder in place. The ligaments may be stretched or torn. Besides the main shoulder joint (the ball and socket), there are several smaller joints that connect the bones in this area. A sprain usually involves one of those joints. Most often it is the acromioclavicular (or AC) joint. That is the joint that connects the collarbone (clavicle) and the shoulder blade (scapula) at the top point of the shoulder blade (acromion). °A shoulder sprain is a mild form of what is called a shoulder separation. Recovering from a shoulder sprain may take some time. For some, pain lingers for several months. Most people recover without long term problems. °CAUSES  °· A shoulder sprain is usually caused by some kind of trauma. This might be: °¨   Falling on an outstretched arm. °¨ Being hit hard on the shoulder. °¨ Twisting the arm. °· Shoulder sprains are more likely to occur in people who: °¨ Play sports. °¨ Have balance or coordination problems. °SYMPTOMS  °· Pain when you move your shoulder. °· Limited ability to move the shoulder. °· Swelling and tenderness on top of the shoulder. °· Redness  or warmth in the shoulder. °· Bruising. °· A change in the shape of the shoulder. °DIAGNOSIS  °Your healthcare provider may: °· Ask about your symptoms. °· Ask about recent activity that might have caused those symptoms. °· Examine your shoulder. You may be asked to do simple exercises to test movement. The other shoulder will be examined for comparison. °· Order some tests that provide a look inside the body. They can show the extent of the injury. The tests could include: °¨ X-rays. °¨ CT (computed tomography) scan. °¨ MRI (magnetic resonance imaging) scan. °RISKS AND COMPLICATIONS °· Loss of full shoulder motion. °· Ongoing shoulder pain. °TREATMENT  °How long it takes to recover from a shoulder sprain depends on how severe it was. Treatment options may include: °· Rest. You should not use the arm or shoulder until it heals. °· Ice. For 2 or 3 days after the injury, put an ice pack on the shoulder up to 4 times a day. It should stay on for 15 to 20 minutes each time. Wrap the ice in a towel so it does not touch your skin. °· Over-the-counter medicine to relieve pain. °· A sling or brace. This will keep the arm still while the shoulder is healing. °· Physical therapy or rehabilitation exercises. These will help you regain strength and motion. Ask your healthcare provider when it is OK to begin these exercises. °· Surgery. The need for surgery is rare with a sprained shoulder, but some people may need surgery to keep the joint in place and reduce pain. °HOME CARE INSTRUCTIONS  °· Ask your healthcare provider about what you should and should not do while your shoulder heals. °· Make sure you know how to apply ice to the correct area of your shoulder. °· Talk with your healthcare provider about which medications should be used for pain and swelling. °· If rehabilitation therapy will be needed, ask your healthcare provider to refer you to a therapist. If it is not recommended, then ask about at-home exercises. Find  out when exercise should begin. °SEEK MEDICAL CARE IF:  °Your pain, swelling, or redness at the joint increases. °SEEK IMMEDIATE MEDICAL CARE IF:  °· You have a fever. °· You cannot move your arm or shoulder. °Document Released: 07/26/2008 Document Revised: 06/01/2011 Document Reviewed: 07/26/2008 °ExitCare® Patient Information ©2015 [employer]. This information is not intended to replace advice given to you by your health care provider. Make sure you discuss any questions you have with your health care provider. ° °

## 2015-06-26 ENCOUNTER — Other Ambulatory Visit: Payer: Self-pay | Admitting: Orthopedic Surgery

## 2015-06-26 DIAGNOSIS — R531 Weakness: Secondary | ICD-10-CM

## 2015-06-26 DIAGNOSIS — M25561 Pain in right knee: Secondary | ICD-10-CM

## 2015-07-02 ENCOUNTER — Other Ambulatory Visit: Payer: Medicaid Other

## 2015-07-03 ENCOUNTER — Other Ambulatory Visit: Payer: Medicaid Other

## 2015-07-07 ENCOUNTER — Ambulatory Visit
Admission: RE | Admit: 2015-07-07 | Discharge: 2015-07-07 | Disposition: A | Discharge status: Home / Self Care | Payer: Medicaid Other | Source: Ambulatory Visit | Attending: Orthopedic Surgery | Admitting: Orthopedic Surgery

## 2015-07-07 DIAGNOSIS — R531 Weakness: Secondary | ICD-10-CM

## 2015-07-07 DIAGNOSIS — M25561 Pain in right knee: Secondary | ICD-10-CM

## 2015-12-27 ENCOUNTER — Encounter (HOSPITAL_COMMUNITY): Payer: Self-pay | Admitting: Vascular Surgery

## 2015-12-27 DIAGNOSIS — Y929 Unspecified place or not applicable: Secondary | ICD-10-CM | POA: Diagnosis not present

## 2015-12-27 DIAGNOSIS — S0990XA Unspecified injury of head, initial encounter: Secondary | ICD-10-CM | POA: Diagnosis not present

## 2015-12-27 DIAGNOSIS — Y939 Activity, unspecified: Secondary | ICD-10-CM | POA: Diagnosis not present

## 2015-12-27 DIAGNOSIS — Y999 Unspecified external cause status: Secondary | ICD-10-CM | POA: Insufficient documentation

## 2015-12-27 DIAGNOSIS — S0083XA Contusion of other part of head, initial encounter: Secondary | ICD-10-CM | POA: Insufficient documentation

## 2015-12-27 DIAGNOSIS — S0993XA Unspecified injury of face, initial encounter: Secondary | ICD-10-CM | POA: Diagnosis present

## 2015-12-27 DIAGNOSIS — F17210 Nicotine dependence, cigarettes, uncomplicated: Secondary | ICD-10-CM | POA: Insufficient documentation
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
## 2015-12-28 ENCOUNTER — Emergency Department (HOSPITAL_COMMUNITY)
Admission: EM | Admit: 2015-12-28 | Discharge: 2015-12-28 | Disposition: A | Discharge status: Home / Self Care | Payer: Medicaid Other | Attending: Emergency Medicine | Admitting: Emergency Medicine

## 2015-12-28 ENCOUNTER — Emergency Department (HOSPITAL_COMMUNITY): Payer: Medicaid Other

## 2015-12-28 DIAGNOSIS — S0083XA Contusion of other part of head, initial encounter: Secondary | ICD-10-CM

## 2015-12-28 DIAGNOSIS — S0990XA Unspecified injury of head, initial encounter: Secondary | ICD-10-CM

## 2015-12-28 MED ORDER — IBUPROFEN 800 MG PO TABS: 800.0000 mg | ORAL_TABLET | Freq: Three times a day (TID) | ORAL | 0 refills | Status: DC

## 2015-12-28 MED ORDER — TRAMADOL HCL 50 MG PO TABS: 50.0000 mg | ORAL_TABLET | Freq: Four times a day (QID) | ORAL | 0 refills | Status: DC | PRN
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
## 2015-12-28 NOTE — ED Notes (Signed)
MD at bedside. 

## 2016-09-01 ENCOUNTER — Emergency Department (HOSPITAL_COMMUNITY)
Admission: EM | Admit: 2016-09-01 | Discharge: 2016-09-01 | Disposition: A | Discharge status: Home / Self Care | Payer: Medicaid Other | Attending: Emergency Medicine | Admitting: Emergency Medicine

## 2016-09-01 ENCOUNTER — Encounter (HOSPITAL_COMMUNITY): Payer: Self-pay | Admitting: Emergency Medicine

## 2016-09-01 DIAGNOSIS — R222 Localized swelling, mass and lump, trunk: Secondary | ICD-10-CM | POA: Diagnosis present

## 2016-09-01 DIAGNOSIS — F17210 Nicotine dependence, cigarettes, uncomplicated: Secondary | ICD-10-CM | POA: Diagnosis not present

## 2016-09-01 DIAGNOSIS — Z79899 Other long term (current) drug therapy: Secondary | ICD-10-CM | POA: Insufficient documentation

## 2016-09-01 DIAGNOSIS — L02212 Cutaneous abscess of back [any part, except buttock]: Secondary | ICD-10-CM | POA: Diagnosis not present

## 2016-09-01 MED ORDER — LIDOCAINE HCL 2 % IJ SOLN
5.0000 mL | Freq: Once | INTRAMUSCULAR | Status: AC
  Administered 2016-09-01: 100 mg via INTRADERMAL
  Filled 2016-09-01: qty 20

## 2016-09-01 NOTE — Discharge Instructions (Signed)
Keep wound clean and dry. Apply warm compresses to the area throughout the day. Take ibuprofen/tylenol as needed for pain Follow Up: Return in 2 days for wound recheck unless the area has healed entirely.  Return to emergency department sooner for emergent changing or worsening symptoms.

## 2016-09-01 NOTE — ED Provider Notes (Signed)
[facility]-EMERGENCY DEPT Provider Note   CSN: [id number] Arrival date & time: 09/01/16  2043     History   Chief Complaint Chief Complaint  Patient presents with  . Insect Bite    HPI Kylie Nicholson is a 46 y.o. female With history of eczema who presents a with chief complaint acute onset, gradually worsening area of redness and swelling to the left upper back for 3 days. Patient states she noticed an area Of tenderness which she believes to be a mosquito bite 3 days ago, which she states has progressively gotten more tender and swollen. She states pain is currently constant and aching in nature and does not radiate. Pain is worsened with palpation and movement of her left shoulder. She denies witnessed insect bite. No new soaps shampoos detergents or lotions. No one at home has a similar rash. She has tried Neosporin and applied pressure after a shower and was able to express a small amount of purulence. She denies fevers, chills, chest pain, shortness of breath, abdominal pain. She does endorse generalized myalgias.  The history is provided by the patient.    Past Medical History:  Diagnosis Date  . Eczema     There are no active problems to display for this patient.   History reviewed. No pertinent surgical history.  OB History    No data available       Home Medications    Prior to Admission medications   Medication Sig Start Date End Date Taking? Authorizing Provider  ibuprofen (ADVIL,MOTRIN) 800 MG tablet Take 1 tablet (800 mg total) by mouth 3 (three) times daily. 12/28/15   Gilda CreasePollina, Christopher J, MD  traMADol (ULTRAM) 50 MG tablet Take 1 tablet (50 mg total) by mouth every 6 (six) hours as needed. 12/28/15   Gilda CreasePollina, Christopher J, MD    Family History No family history on file.  Social History Social History  Substance Use Topics  . Smoking status: Current Every Day Smoker    Packs/day: 0.50    Types: Cigarettes  . Smokeless tobacco: Never Used  . Alcohol use  0.6 oz/week    1 Shots of liquor per week     Comment: occ     Allergies   Banana and Peach [prunus persica]   Review of Systems Review of Systems  Constitutional: Negative for chills and fever.  Respiratory: Negative for shortness of breath.   Cardiovascular: Negative for chest pain.  Gastrointestinal: Negative for abdominal pain.  Musculoskeletal: Positive for myalgias.  Skin: Positive for color change.       swelling  All other systems reviewed and are negative.    Physical Exam Updated Vital Signs BP 118/62 (BP Location: Left Arm)   Pulse 85   Temp 98.4 F (36.9 C) (Oral)   Resp 18   LMP 08/18/2016   SpO2 100%   Physical Exam  Constitutional: She appears well-developed and well-nourished.  HENT:  Head: Normocephalic and atraumatic.  Eyes: Conjunctivae are normal. Right eye exhibits no discharge. Left eye exhibits no discharge.  Neck: No JVD present. No tracheal deviation present.  Cardiovascular: Normal rate.   Pulmonary/Chest: Effort normal.  Abdominal: She exhibits no distension.  Musculoskeletal: She exhibits no edema.  Neurological: She is alert.  Skin: Skin is warm and dry.  1 mm pustule that has come to a head at the left posterior thoracic area just inferior to the scapula. Mild surrounding erythema. There is underlying fluctuance. Very tender to palpation. No drainage.  Psychiatric: She has  a normal mood and affect. Her behavior is normal.     ED Treatments / Results  Labs (all labs ordered are listed, but only abnormal results are displayed) Labs Reviewed - No data to display  EKG  EKG Interpretation None       Radiology No results found.  Procedures .Marland KitchenIncision and Drainage Date/Time: 09/01/2016 10:30 PM Performed by: Michela Pitcher A Authorized by: Michela Pitcher A   Consent:    Consent obtained:  Verbal   Consent given by:  Patient   Risks discussed:  Bleeding, pain, incomplete drainage and infection Location:    Type:  Abscess    Size:  5mm   Location:  Trunk   Trunk location:  Back Pre-procedure details:    Skin preparation:  Betadine Anesthesia (see MAR for exact dosages):    Anesthesia method:  Local infiltration   Local anesthetic:  Lidocaine 2% w/o epi Procedure type:    Complexity:  Simple Procedure details:    Needle aspiration: no     Incision types:  Stab incision   Incision depth:  Dermal   Scalpel blade:  11   Wound management:  Probed and deloculated   Drainage:  Bloody and purulent   Drainage amount:  Scant   Wound treatment:  Wound left open Post-procedure details:    Patient tolerance of procedure:  Tolerated well, no immediate complications Comments:     Xeroform gauze applied on top.    (including critical care time)  Medications Ordered in ED Medications  lidocaine (XYLOCAINE) 2 % (with pres) injection 100 mg (100 mg Intradermal Given 09/01/16 2210)     Initial Impression / Assessment and Plan / ED Course  I have reviewed the triage vital signs and the nursing notes.  Pertinent labs & imaging results that were available during my care of the patient were reviewed by me and considered in my medical decision making (see chart for details).     Patient with skin abscess amenable to incision and drainage.  Abscess was not large enough to warrant packing or drain, wound recheck in 2 days. Encouraged home warm soaks and flushing.  Mild signs of cellulitis surrounding skin.  Will d/c to home.  No antibiotic therapy is indicated. Discussed indications for return to the ED sooner. Pt verbalized understanding of and agreement with plan and is safe for discharge home at this time.   Final Clinical Impressions(s) / ED Diagnoses   Final diagnoses:  Abscess of upper back excluding scapular region    New Prescriptions New Prescriptions   No medications on file     Bennye Alm 09/01/16 2232    Mancel Bale, MD 09/03/16 1321

## 2016-09-01 NOTE — ED Triage Notes (Signed)
Patient c/o insect bite to left upper back x3 days. Reports pain at site worsens with movement. Ambulatory to triage.

## 2016-09-04 ENCOUNTER — Emergency Department (HOSPITAL_COMMUNITY)
Admission: EM | Admit: 2016-09-04 | Discharge: 2016-09-05 | Disposition: A | Discharge status: Home / Self Care | Payer: Medicaid Other | Attending: Emergency Medicine | Admitting: Emergency Medicine

## 2016-09-04 ENCOUNTER — Encounter (HOSPITAL_COMMUNITY): Payer: Self-pay | Admitting: Nurse Practitioner

## 2016-09-04 DIAGNOSIS — L02212 Cutaneous abscess of back [any part, except buttock]: Secondary | ICD-10-CM | POA: Diagnosis not present

## 2016-09-04 DIAGNOSIS — F17210 Nicotine dependence, cigarettes, uncomplicated: Secondary | ICD-10-CM | POA: Diagnosis not present

## 2016-09-04 DIAGNOSIS — L0291 Cutaneous abscess, unspecified: Secondary | ICD-10-CM

## 2016-09-04 NOTE — ED Triage Notes (Signed)
Pt presents for re-evaluation of a lesion that looks like an abscess/foliclitis that she states has barely improved. Denies fever or chills.
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
## 2016-09-05 MED ORDER — SULFAMETHOXAZOLE-TRIMETHOPRIM 800-160 MG PO TABS: 1.0000 | ORAL_TABLET | Freq: Two times a day (BID) | ORAL | 0 refills | Status: AC

## 2016-09-05 MED ORDER — LIDOCAINE-EPINEPHRINE (PF) 2 %-1:200000 IJ SOLN
10.0000 mL | Freq: Once | INTRAMUSCULAR | Status: AC
  Administered 2016-09-05: 10 mL
  Filled 2016-09-05: qty 20

## 2016-09-05 MED ORDER — SULFAMETHOXAZOLE-TRIMETHOPRIM 800-160 MG PO TABS
1.0000 | ORAL_TABLET | Freq: Once | ORAL | Status: AC
  Administered 2016-09-05: 1 via ORAL
  Filled 2016-09-05: qty 1

## 2016-09-05 MED ORDER — HYDROCODONE-ACETAMINOPHEN 5-325 MG PO TABS: 1.0000 | ORAL_TABLET | ORAL | 0 refills | Status: DC | PRN

## 2016-09-05 MED ORDER — IBUPROFEN 600 MG PO TABS: 600.0000 mg | ORAL_TABLET | Freq: Four times a day (QID) | ORAL | 0 refills | Status: DC | PRN

## 2016-09-05 MED ORDER — OXYCODONE-ACETAMINOPHEN 5-325 MG PO TABS
1.0000 | ORAL_TABLET | Freq: Once | ORAL | Status: AC
  Administered 2016-09-05: 1 via ORAL
  Filled 2016-09-05: qty 1

## 2016-09-05 NOTE — Discharge Instructions (Signed)
You have been seen today for a skin abscess.  Remove the bandage after 24 hours. You must wait at least 8 hours after the wound repair to wash the wound. Clean the wound and surrounding area gently with tap water and mild soap. Rinse well and blot dry. Do not scrub the wound, as this may cause the wound edges to come apart. You may shower. A bath with Epsom salts may help healing. Clean the wound daily to prevent reinfection. Do not use cleaners such as hydrogen peroxide or alcohol.   Scar reduction: Application of a topical antibiotic ointment, such as Neosporin, after the wound has begun to close and heal well can decrease scab formation and reduce scarring. After the wound has healed and wound closures have been removed, application of ointments such as Aquaphor can also reduce scar formation.  Pain: You may use Tylenol, naproxen, or ibuprofen for pain. Vicodin for severe pain. Do not drive or perform other dangerous activities while taking the Vicodin.  Please take all of your antibiotics until finished!   You may develop abdominal discomfort or diarrhea from the antibiotic.  You may help offset this with probiotics which you can buy or get in yogurt. Do not eat or take the probiotics until 2 hours after your antibiotic.   Return to the ED in 2 days for wound check.   In the future, cleaning your skin with an over-the-counter solution called chlorhexidine daily for a week to help prevent future incidences of this type.

## 2016-09-06 ENCOUNTER — Encounter (HOSPITAL_COMMUNITY): Payer: Self-pay | Admitting: Nurse Practitioner

## 2016-09-06 ENCOUNTER — Emergency Department (HOSPITAL_COMMUNITY)
Admission: EM | Admit: 2016-09-06 | Discharge: 2016-09-06 | Disposition: A | Discharge status: Home / Self Care | Payer: Medicaid Other | Attending: Emergency Medicine | Admitting: Emergency Medicine

## 2016-09-06 DIAGNOSIS — R0981 Nasal congestion: Secondary | ICD-10-CM | POA: Diagnosis not present

## 2016-09-06 DIAGNOSIS — Z48817 Encounter for surgical aftercare following surgery on the skin and subcutaneous tissue: Secondary | ICD-10-CM | POA: Diagnosis present

## 2016-09-06 DIAGNOSIS — F17210 Nicotine dependence, cigarettes, uncomplicated: Secondary | ICD-10-CM | POA: Diagnosis not present

## 2016-09-06 DIAGNOSIS — J029 Acute pharyngitis, unspecified: Secondary | ICD-10-CM | POA: Diagnosis not present

## 2016-09-06 DIAGNOSIS — Z09 Encounter for follow-up examination after completed treatment for conditions other than malignant neoplasm: Secondary | ICD-10-CM

## 2016-09-06 MED ORDER — LIDOCAINE-EPINEPHRINE (PF) 2 %-1:200000 IJ SOLN
20.0000 mL | Freq: Once | INTRAMUSCULAR | Status: DC
  Filled 2016-09-06: qty 20

## 2016-09-06 NOTE — ED Triage Notes (Signed)
Patient states she was here 2 days ago for upper back abscess which was drained and packed. She reports she was told to come back Sunday to have  Packing removed and site evaluated however, tonight when she was changing the dressing the packing came out and she presents for evaluation. Denies any recent fever or injury to the area.

## 2016-09-06 NOTE — Discharge Instructions (Signed)
Abscess looks good.  Continue dressing changes at least 1-2 times daily. For sore throat and congestion can use mucinex, warm tea with honey, etc. Follow-up with your primary care doctor if you have any ongoing issues. Return here for any new/acute changes.

## 2016-09-06 NOTE — ED Provider Notes (Signed)
[facility]EMERGENCY DEPT Provider Note   CSN: [id number] Arrival date & time: 09/06/16  0122     History   Chief Complaint Chief Complaint  Patient presents with  . Abscess    On back     HPI Kylie Nicholson is a 46 y.o. female.  The history is provided by the patient and medical records.  Abscess    46 y.o. F with hx of eczema, here for re-check of abscess on her back.  Patient states she had abscess drained about 2 days ago. States she was due to have the packing removed today but when she was changing her dressing yesterday she actually pulled the packing out. States she came in just to make sure everything was okay. She's not had any fever or chills. She's been taking her antibiotics as prescribed. States pain in her back is improving.  She did additionally add that she has had a little bit of a sore throat and some nasal congestion. She's not had any sick contacts. She denies any cough, chest pain, shortness of breath.  Past Medical History:  Diagnosis Date  . Eczema     There are no active problems to display for this patient.   History reviewed. No pertinent surgical history.  OB History    No data available       Home Medications    Prior to Admission medications   Medication Sig Start Date End Date Taking? Authorizing Provider  HYDROcodone-acetaminophen (NORCO/VICODIN) 5-325 MG tablet Take 1-2 tablets by mouth every 4 (four) hours as needed for severe pain. 09/05/16  Yes Joy, Shawn C, PA-C  sulfamethoxazole-trimethoprim (BACTRIM DS,SEPTRA DS) 800-160 MG tablet Take 1 tablet by mouth 2 (two) times daily. 09/05/16 09/12/16 Yes Joy, Hillard Danker, PA-C    Family History History reviewed. No pertinent family history.  Social History Social History  Substance Use Topics  . Smoking status: Current Every Day Smoker    Packs/day: 0.50    Types: Cigarettes  . Smokeless tobacco: Never Used  . Alcohol use 0.6 oz/week    1 Shots of liquor per week     Comment: occ      Allergies   Banana and Peach [prunus persica]   Review of Systems Review of Systems  Skin: Positive for wound.  All other systems reviewed and are negative.    Physical Exam Updated Vital Signs BP 111/73 (BP Location: Left Arm)   Pulse 78   Temp 98.9 F (37.2 C) (Oral)   Resp 18   Ht 5\' 4"  (1.626 m)   Wt 82.6 kg (182 lb)   LMP 08/18/2016   SpO2 99%   BMI 31.24 kg/m   Physical Exam  Constitutional: She is oriented to person, place, and time. She appears well-developed and well-nourished.  HENT:  Head: Normocephalic and atraumatic.  Right Ear: Tympanic membrane and ear canal normal.  Left Ear: Tympanic membrane and ear canal normal.  Nose: Mucosal edema present.  Mouth/Throat: Oropharynx is clear and moist.  Tonsils overall normal in appearance bilaterally without exudate; uvula midline without evidence of peritonsillar abscess; handling secretions appropriately; no difficulty swallowing or speaking; normal phonation without stridor; some PND noted  Eyes: Conjunctivae and EOM are normal. Pupils are equal, round, and reactive to light.  Neck: Normal range of motion.  Cardiovascular: Normal rate, regular rhythm and normal heart sounds.   Pulmonary/Chest: Effort normal and breath sounds normal. No respiratory distress. She has no wheezes.  Abdominal: Soft. Bowel sounds are normal.  Musculoskeletal:  Normal range of motion.  Open abscess of central upper back, packing has been removed, wound seems to be healing well, still small amount of purulent drainage noted; minimal surrounding erythema  Neurological: She is alert and oriented to person, place, and time.  Skin: Skin is warm and dry.  Psychiatric: She has a normal mood and affect.  Nursing note and vitals reviewed.    ED Treatments / Results  Labs (all labs ordered are listed, but only abnormal results are displayed) Labs Reviewed - No data to display  EKG  EKG Interpretation None       Radiology No  results found.  Procedures Procedures (including critical care time)  Medications Ordered in ED Medications - No data to display   Initial Impression / Assessment and Plan / ED Course  I have reviewed the triage vital signs and the nursing notes.  Pertinent labs & imaging results that were available during my care of the patient were reviewed by me and considered in my medical decision making (see chart for details).  46 year old female here for recheck of abscess of her back. This was drained 2 days ago. Reports her packing came out one day early. She continues to have a small amount of purulent drainage, but wound otherwise seems to be healing well. She is afebrile and nontoxic. Wound was redressed here, will have her continue her antibiotics.  In regards to her sore throat, she did have a little bit of nasal congestion and postnasal drip on exam but no significant tonsillar edema or exudates. She is handling her secretions well. Normal phonation without stridor. Sore throat may be subsequent to her postnasal drip. Encouraged supportive care, mucinex, warm tea with honey, etc.  Can follow-up with PCP for any ongoing issues.  Discussed plan with patient, she acknowledged understanding and agreed with plan of care.  Return precautions given for new or worsening symptoms.   Final Clinical Impressions(s) / ED Diagnoses   Final diagnoses:  Encounter for recheck of abscess following incision and drainage  Sore throat    New Prescriptions Discharge Medication List as of 09/06/2016  6:18 AM       Garlon HatchetSanders, Veida Spira M, PA-C 09/06/16 [id number]    Melene PlanFloyd, Dan, DO 09/06/16 [phone]

## 2016-10-30 ENCOUNTER — Encounter (HOSPITAL_COMMUNITY): Payer: Self-pay | Admitting: *Deleted

## 2016-10-30 ENCOUNTER — Ambulatory Visit (HOSPITAL_COMMUNITY)
Admission: EM | Admit: 2016-10-30 | Discharge: 2016-10-30 | Disposition: A | Discharge status: Home / Self Care | Payer: Medicaid Other | Attending: Family Medicine | Admitting: Family Medicine

## 2016-10-30 DIAGNOSIS — M25561 Pain in right knee: Secondary | ICD-10-CM

## 2016-10-30 DIAGNOSIS — M25512 Pain in left shoulder: Secondary | ICD-10-CM

## 2016-10-30 DIAGNOSIS — M7651 Patellar tendinitis, right knee: Secondary | ICD-10-CM

## 2016-10-30 DIAGNOSIS — M7522 Bicipital tendinitis, left shoulder: Secondary | ICD-10-CM

## 2016-10-30 MED ORDER — IBUPROFEN 800 MG PO TABS: 800.0000 mg | ORAL_TABLET | Freq: Three times a day (TID) | ORAL | 0 refills | Status: DC

## 2016-10-30 MED ORDER — GABAPENTIN 300 MG PO CAPS: 300.0000 mg | ORAL_CAPSULE | Freq: Three times a day (TID) | ORAL | 0 refills | Status: DC

## 2016-10-30 NOTE — ED Provider Notes (Signed)
  [facility]   [id number] 10/30/16 Arrival Time: 1042  ASSESSMENT & PLAN:  1. Acute pain of right knee   2. Biceps tendonitis on left   3. Acute pain of left shoulder   4. Patellar tendonitis of right knee     Meds ordered this encounter  Medications  . ibuprofen (ADVIL,MOTRIN) 800 MG tablet    Sig: Take 1 tablet (800 mg total) by mouth 3 (three) times daily.    Dispense:  21 tablet    Refill:  0    Order Specific Question:   Supervising Provider    Answer:   Eustace MooreMURRAY, LAURA W [[id number]]  . gabapentin (NEURONTIN) 300 MG capsule    Sig: Take 1 capsule (300 mg total) by mouth 3 (three) times daily.    Dispense:  90 capsule    Refill:  0    Order Specific Question:   Supervising Provider    Answer:   Eustace MooreMURRAY, LAURA W [[id number]]    Reviewed expectations re: course of current medical issues. Questions answered. Outlined signs and symptoms indicating need for more acute intervention. Patient verbalized understanding. After Visit Summary given.   SUBJECTIVE:  Kylie Nicholson is a 46 y.o. female who presents with complaint of having left arm discomfort and left shoulder discomfort.  She attributes this to having recently an abscess on her left shoulder. She states after the abscess healed she developed left shoulder pain and numbness in her left hand.  She c/o left bicep muscle cramps and myalgias in her left shoulder.  She c/o right knee pain that is severe in pain and is worse with bending or stepping up on steps.  She states the pain is worse at work.  ROS: As per HPI.   OBJECTIVE:  Vitals:   10/30/16 1100  BP: (!) 147/79  Pulse: (!) 56  Resp: 15  Temp: 98 F (36.7 C)  TempSrc: Oral  SpO2: 100%     General appearance: alert; no distress HEENT: normocephalic; atraumatic; conjunctivae normal;  Lungs: clear to auscultation bilaterally Heart: regular rate and rhythm MS - Exaggerated response to pain with palpation of left biceps, left shoulder, and right knee.  She  has right knee prepatellar tenderness.  She has FSROM left shoulder. Neurologic: normal symmetric reflexes; normal gait Psychological:  alert and cooperative; normal mood and affect    Labs Reviewed - No data to display  No results found.  Allergies  Allergen Reactions  . Banana Anaphylaxis and Itching  . Peach [Prunus Persica] Anaphylaxis and Itching    PMHx, SurgHx, SocialHx, Medications, and Allergies were reviewed in the Visit Navigator and updated as appropriate.      Kylie Nicholson, Kylie Anastos J, FNP 10/30/16 1145

## 2016-10-30 NOTE — ED Triage Notes (Signed)
Patient reports right knee pain, reports history of surgery to right knee. States she thinks it is hurting from standing at work.  Patient also reports left arm pain and numbness. States about a month ago she had an abscess drained to left upper back and since then she has had discomfort to left arm. States she has intermittent episodes of numbness and muscle contractions.

## 2016-10-30 NOTE — Discharge Instructions (Signed)
Please follow up with your primary care provider.

## 2016-11-27 ENCOUNTER — Other Ambulatory Visit: Payer: Self-pay | Admitting: Orthopedic Surgery

## 2016-11-27 DIAGNOSIS — R52 Pain, unspecified: Secondary | ICD-10-CM

## 2016-12-25 ENCOUNTER — Other Ambulatory Visit: Payer: Self-pay

## 2017-01-10 ENCOUNTER — Ambulatory Visit
Admission: RE | Admit: 2017-01-10 | Discharge: 2017-01-10 | Disposition: A | Discharge status: Home / Self Care | Payer: Medicaid Other | Source: Ambulatory Visit | Attending: Orthopedic Surgery | Admitting: Orthopedic Surgery

## 2017-01-10 DIAGNOSIS — R52 Pain, unspecified: Secondary | ICD-10-CM

## 2017-06-07 ENCOUNTER — Ambulatory Visit: Payer: Medicaid Other | Admitting: Allergy

## 2017-06-07 ENCOUNTER — Encounter: Payer: Self-pay | Admitting: Allergy

## 2017-06-07 VITALS — BP 116/74 | HR 76 | Temp 98.4°F | Resp 16 | Ht 64.0 in | Wt 183.2 lb

## 2017-06-07 DIAGNOSIS — H1010 Acute atopic conjunctivitis, unspecified eye: Secondary | ICD-10-CM

## 2017-06-07 DIAGNOSIS — J4520 Mild intermittent asthma, uncomplicated: Secondary | ICD-10-CM | POA: Diagnosis not present

## 2017-06-07 DIAGNOSIS — J309 Allergic rhinitis, unspecified: Secondary | ICD-10-CM | POA: Diagnosis not present

## 2017-06-07 DIAGNOSIS — L2089 Other atopic dermatitis: Secondary | ICD-10-CM

## 2017-06-07 DIAGNOSIS — T781XXA Other adverse food reactions, not elsewhere classified, initial encounter: Secondary | ICD-10-CM

## 2017-06-07 DIAGNOSIS — T7819XA Other adverse food reactions, not elsewhere classified, initial encounter: Secondary | ICD-10-CM

## 2017-06-07 MED ORDER — FLUTICASONE PROPIONATE 50 MCG/ACT NA SUSP: NASAL | 5 refills | Status: DC

## 2017-06-07 MED ORDER — OLOPATADINE HCL 0.7 % OP SOLN: 1.0000 [drp] | Freq: Every day | OPHTHALMIC | 5 refills | Status: DC

## 2017-06-07 MED ORDER — HYDROCORTISONE 1 % EX CREA: 1.0000 "application " | TOPICAL_CREAM | Freq: Every day | CUTANEOUS | 2 refills | Status: DC

## 2017-06-07 MED ORDER — CRISABOROLE 2 % EX OINT: 1.0000 "application " | TOPICAL_OINTMENT | Freq: Two times a day (BID) | CUTANEOUS | 5 refills | Status: DC

## 2017-06-07 NOTE — Progress Notes (Signed)
New Patient Note  RE: Kylie Nicholson MRN: [id number] DOB: Jul 28, 1970 Date of Office Visit: 06/07/2017  Referring provider: Avel SensorPuglisi, Janis P, FNP Primary care provider: Dolan AmenBailey, Sarah M, FNP  Chief Complaint: Eye allergy symptoms  History of present illness: Kylie Nicholson is a 47 y.o. female presenting today for consultation for allergic conjunctivitis.   She reports she has most issues with her eyes.  Symptoms started getting worse several years ago.  She reports her son moved several years ago to a home that had carpeting and had a dog in the home.  Her son has since moved to a new home without carpeting but dog is still there and son also got a cat.  She frequents the home often.  She notices that her eyes get itchy, watery, puffy when she is at this home.  She has used cold compresses to help with these symptoms.  She reports she has used Optivar which she does not feel helped.  She used up the eye drop as she was using it frequently throughout the day for relief of eye symptoms.  She has used zyrtec which she started several weeks ago and feels it has helped somewhat.   She also reports having had nasal congestion/rainage and sneezing before.  She believes she has been on singulair before.      She reports having bad eczema with problem areas being her arms, neck, face, chest, back, legs.   She uses triamcinolone which does help and she applies everyday.  She reports having eczema since about 478-47 years old.  She moisturizes with Dermasil or vaseline.    She has a history of SOB and recurrent bronchitis.  She has an albuterol inhaler that she only needs to use with respiratory illnesses or during pollen season.  No hospitalizations before.  Denies any nighttime awakenings outside of respiratory illness.  She denies any oral steroids for respiratory issues.  She has never used a daily controller inhaler.    She states with peaches, bananas, watermelon and mango her mouth gets itchy. She first  noticed this about 5 years ago with peaches.  She avoids peaches and bananas but states will still eat watermelon on occasion.  She denies any respiratory, GI or CV related symptoms with ingestion.    Review of systems: Review of Systems  Constitutional: Negative for chills, fever and malaise/fatigue.  HENT: Negative for congestion, ear discharge, ear pain, nosebleeds, sinus pain and sore throat.   Eyes: Positive for redness. Negative for pain and discharge.  Respiratory: Negative for cough, shortness of breath and wheezing.   Cardiovascular: Negative for chest pain.  Gastrointestinal: Negative for abdominal pain, constipation, diarrhea, heartburn, nausea and vomiting.  Musculoskeletal: Negative for joint pain and myalgias.  Skin: Positive for itching and rash.  Neurological: Negative for headaches.    All other systems negative unless noted above in HPI  Past medical history: Past Medical History:  Diagnosis Date  . Eczema     Past surgical history: Past Surgical History:  Procedure Laterality Date  . KNEE SURGERY      Family history:  Family History  Problem Relation Age of Onset  . Asthma Son   . Asthma Son   . Allergic rhinitis Son   . Asthma Son   . Angioedema Neg Hx   . Eczema Neg Hx   . Urticaria Neg Hx     Social history: She lives in a home with without carpeting with gas and electric heating and central  cooling.  There are no pets in her home which she is exposed to pets at family members homes.  There is no concern for water damage, mildew or roaches in the home.  She works as a Lawyer. Tobacco Use  . Smoking status: Current Every Day Smoker    Packs/day: 0.50    Types: Cigarettes  . Smokeless tobacco: Never Used  Substance and Sexual Activity  . Alcohol use: Yes    Alcohol/week: 0.6 oz    Types: 1 Shots of liquor per week    Comment: occ  . Drug use: Yes    Types: Marijuana  Social History Narrative    Medication List: Allergies as of 06/07/2017       Reactions   Banana Anaphylaxis, Itching   Peach [prunus Persica] Anaphylaxis, Itching      Medication List        Accurate as of 06/07/17 12:11 PM. Always use your most recent med list.          albuterol 108 (90 Base) MCG/ACT inhaler Commonly known as:  PROVENTIL HFA;VENTOLIN HFA Inhale into the lungs.   azelastine 0.05 % ophthalmic solution Commonly known as:  OPTIVAR Place one drop into each eye twice a day as needed for itching   cetirizine 10 MG tablet Commonly known as:  ZYRTEC Take one tablet (10 mg) daily when needed for allergies/sneezing/itching   ibuprofen 800 MG tablet Commonly known as:  ADVIL,MOTRIN Take 1 tablet (800 mg total) by mouth 3 (three) times daily.   meloxicam 7.5 MG tablet Commonly known as:  MOBIC Take 7.5 mg by mouth daily.   triamcinolone cream 0.1 % Commonly known as:  KENALOG Apply sparingly once to twice a day as needed for eczema not > 7 days       Known medication allergies: Allergies  Allergen Reactions  . Banana Anaphylaxis and Itching  . Peach [Prunus Persica] Anaphylaxis and Itching     Physical examination: Blood pressure 116/74, pulse 76, temperature 98.4 F (36.9 C), temperature source Oral, resp. rate 16, height 5\' 4"  (1.626 m), weight 183 lb 3.2 oz (83.1 kg).  General: Alert, interactive, in no acute distress. HEENT:PERRLA, + [employer], TMs pearly gray, turbinates minimally edematous without discharge, post-pharynx non erythematous. Neck: Supple without lymphadenopathy. Lungs: Clear to auscultation without wheezing, rhonchi or rales. {no increased work of breathing. CV: Normal S1, S2 without murmurs. Abdomen: Nondistended, nontender. Skin: Dry, mildly hyperpigmented, mildly thickened patches on the Arms bilaterally worse in the antecubital fossa, nape of neck. Extremities:  No clubbing, cyanosis or edema. Neuro:   Grossly intact.  Diagnositics/Labs:  Spirometry: FEV1: 2.04L  83%, FVC: 2.44L  81%,  ratio consistent with Nonobstructive pattern  Allergy testing: Environmental allergy skin prick testing is positive to grasses, weeds, trees, molds, dust mites and tobacco leaf. Intradermal testing is positive to major mold mix 2 Allergy testing results were read and interpreted by provider, documented by clinical staff.   Assessment and plan:   Allergic rhinoconjunctivitis   -Environmental allergy skin prick testing today is positive to grasses, trees, weeds, molds, dust mite, tobacco leaf    -Allergen avoidance measures discussed and handouts provided.    -Take Zyrtec 10 mg daily    -Pazeo eyedrop 1 drop each eye as needed daily for itchy, watery, red eyes     -For nasal congestion and drainage recommend Flonase 1-2 sprays each nostril daily for nasal congestion.  Use for 1-2 weeks at a time before stopping once symptoms  improve     -Allergen immunotherapy (allergy shots) discussed today including protocol, benefits and risks.  Informational packet provided  Eczema  -Continue use of triamcinolone thin application twice a day as needed for eczema flares  -Trial Eucrisa, nonsteroidal eczema cream, for flares on her face.  Apply thin layer twice a day during flares.  Can use anywhere on the body including the face.   Samples provided today.   -Will prescribe Eucerin plus hydrocortisone compounded for daily moisturization   Reactive airway disease  -History of shortness of breath and bronchitis episodes with respiratory illness likely indicates that she had reactive airway disease (asthma).    -have access to albuterol inhaler 2 puffs every 4-6 hours as needed for cough/wheeze/shortness of breath/chest tightness.  May use 15-20 minutes prior to activity.   Monitor frequency of use.   Asthma control goals:   Full participation in all desired activities (may need albuterol before activity)  Albuterol use two time or less a week on average (not counting use with activity)  Cough  interfering with sleep two time or less a month  Oral steroids no more than once a year  No hospitalizations  Oral allergy syndrome  -Certain fruits and vegetables as well as nuts can cause oral cavity symptoms like mouth itch or tongue itch and or swelling this is due to pollen sensitivity as these foods can look similar to the body as pollen does.  Symptoms normally do not progressed past the mouth.   Still recommend avoidance to prevent the symptoms from occurring. -Skin prick testing to peaches, banana and watermelon were negative  Follow-up in 6 months or sooner if needed  I appreciate the opportunity to take part in Fatisha's care. Please do not hesitate to contact me with questions.  Sincerely,   Margo Aye, MD Allergy/Immunology [facility]

## 2017-06-07 NOTE — Patient Instructions (Addendum)
Allergic rhinoconjunctivitis   -Environmental allergy skin prick testing today is positive to grasses, trees, weeds, molds, dust mite, tobacco leaf    -Allergen avoidance measures discussed and handouts provided.    -Take Zyrtec 10 mg daily    -Pazeo eyedrop 1 drop each eye as needed daily for itchy, watery, red eyes     -For nasal congestion and drainage recommend Flonase 1-2 sprays each nostril daily for nasal congestion.  Use for 1-2 weeks at a time before stopping once symptoms improve     -Allergen immunotherapy (allergy shots) discussed today including protocol, benefits and risks.  Informational packet provided  Eczema  -Continue use of triamcinolone thin application twice a day as needed for eczema flares  -Trial Eucrisa, nonsteroidal eczema cream, for flares on her face.  Apply thin layer twice a day during flares.  Can use anywhere on the body including the face.   Samples provided today.   -Will prescribe Eucerin plus hydrocortisone compounded for daily moisturization   Reactive airway disease  -History of shortness of breath and bronchitis episodes with respiratory illness likely indicates that she had reactive airway disease (asthma).    -have access to albuterol inhaler 2 puffs every 4-6 hours as needed for cough/wheeze/shortness of breath/chest tightness.  May use 15-20 minutes prior to activity.   Monitor frequency of use.   Asthma control goals:   Full participation in all desired activities (may need albuterol before activity)  Albuterol use two time or less a week on average (not counting use with activity)  Cough interfering with sleep two time or less a month  Oral steroids no more than once a year  No hospitalizations  Oral allergy syndrome  -Certain fruits and vegetables as well as nuts can cause oral cavity symptoms like mouth itch or tongue itch and or swelling this is due to pollen sensitivity as these foods can look similar to the body as pollen does.   Symptoms normally do not progressed past the mouth.   Still recommend avoidance to prevent the symptoms from occurring. -Skin prick testing to peaches, banana and watermelon were negative  Follow-up in 6 months or sooner if needed
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
## 2019-08-08 ENCOUNTER — Emergency Department (HOSPITAL_COMMUNITY): Payer: Self-pay

## 2019-08-08 ENCOUNTER — Emergency Department (HOSPITAL_COMMUNITY)
Admission: EM | Admit: 2019-08-08 | Discharge: 2019-08-08 | Disposition: A | Discharge status: Home / Self Care | Payer: Self-pay | Attending: Emergency Medicine | Admitting: Emergency Medicine

## 2019-08-08 ENCOUNTER — Encounter (HOSPITAL_COMMUNITY): Payer: Self-pay | Admitting: Emergency Medicine

## 2019-08-08 ENCOUNTER — Other Ambulatory Visit: Payer: Self-pay

## 2019-08-08 DIAGNOSIS — Z23 Encounter for immunization: Secondary | ICD-10-CM | POA: Insufficient documentation

## 2019-08-08 DIAGNOSIS — S81811A Laceration without foreign body, right lower leg, initial encounter: Secondary | ICD-10-CM | POA: Insufficient documentation

## 2019-08-08 DIAGNOSIS — Y939 Activity, unspecified: Secondary | ICD-10-CM | POA: Insufficient documentation

## 2019-08-08 DIAGNOSIS — F17210 Nicotine dependence, cigarettes, uncomplicated: Secondary | ICD-10-CM | POA: Insufficient documentation

## 2019-08-08 DIAGNOSIS — Y999 Unspecified external cause status: Secondary | ICD-10-CM | POA: Insufficient documentation

## 2019-08-08 DIAGNOSIS — W230XXA Caught, crushed, jammed, or pinched between moving objects, initial encounter: Secondary | ICD-10-CM | POA: Insufficient documentation

## 2019-08-08 DIAGNOSIS — Y92810 Car as the place of occurrence of the external cause: Secondary | ICD-10-CM | POA: Insufficient documentation

## 2019-08-08 MED ORDER — LORAZEPAM 1 MG PO TABS
1.0000 mg | ORAL_TABLET | Freq: Once | ORAL | Status: AC
  Administered 2019-08-08: 1 mg via ORAL
  Filled 2019-08-08: qty 1

## 2019-08-08 MED ORDER — LIDOCAINE-EPINEPHRINE (PF) 2 %-1:200000 IJ SOLN
10.0000 mL | Freq: Once | INTRAMUSCULAR | Status: AC
  Administered 2019-08-08: 10 mL
  Filled 2019-08-08: qty 20

## 2019-08-08 MED ORDER — TETANUS-DIPHTH-ACELL PERTUSSIS 5-2.5-18.5 LF-MCG/0.5 IM SUSP
0.5000 mL | Freq: Once | INTRAMUSCULAR | Status: AC
  Administered 2019-08-08: 0.5 mL via INTRAMUSCULAR
  Filled 2019-08-08: qty 0.5

## 2019-08-08 NOTE — Discharge Instructions (Signed)
Patient should follow-up with her primary care doctor within the next 8 to 10 days for suture removal.  Patient was instructed not to shower for the first day and then she can shower and clean as needed.  Patient was instructed that if the site becomes red, swollen, very tender, or she develops a fever she should come back and be evaluated immediately.  Patient can take Tylenol and ibuprofen as needed for pain.  Patient should also follow-up with her PCP for her chronic left knee pain.

## 2019-08-08 NOTE — ED Provider Notes (Signed)
[facility] EMERGENCY DEPARTMENT Provider Note   CSN: [id number] Arrival date & time: 08/08/19  0932     History Chief Complaint  Patient presents with  . Extremity Laceration    Kylie Nicholson is a 49 y.o. female.  HPI Patient presents to the emergency department with a laceration on the distal part of her right leg. Patient explained she slammed the car door on her right leg and cut it. She denies any difficulty moving her right foot or ankle. She denies being on any blood thinners and denies any bleeding disorders.  Patient also complains of left knee pain Respirgard for last 2 weeks.  Patient states it hurts all the time especially when she walks.  She denies rashes, swelling, difficulty breathing.    Past Medical History:  Diagnosis Date  . Eczema     There are no problems to display for this patient.   Past Surgical History:  Procedure Laterality Date  . KNEE SURGERY       OB History   No obstetric history on file.     Family History  Problem Relation Age of Onset  . Asthma Son   . Asthma Son   . Allergic rhinitis Son   . Asthma Son   . Angioedema Neg Hx   . Eczema Neg Hx   . Urticaria Neg Hx     Social History   Tobacco Use  . Smoking status: Current Every Day Smoker    Packs/day: 0.50    Types: Cigarettes  . Smokeless tobacco: Never Used  Substance Use Topics  . Alcohol use: Yes    Alcohol/week: 1.0 standard drinks    Types: 1 Shots of liquor per week    Comment: occ  . Drug use: Yes    Types: Marijuana    Home Medications Prior to Admission medications   Medication Sig Start Date End Date Taking? Authorizing Provider  albuterol (PROVENTIL HFA;VENTOLIN HFA) 108 (90 Base) MCG/ACT inhaler Inhale into the lungs. 11/09/16   [provider]  azelastine (OPTIVAR) 0.05 % ophthalmic solution Place one drop into each eye twice a day as needed for itching 04/20/17   [provider]  cetirizine (ZYRTEC) 10 MG tablet  Take one tablet (10 mg) daily when needed for allergies/sneezing/itching 04/20/17   [provider]  Crisaborole (EUCRISA) 2 % OINT Apply 1 application topically 2 (two) times daily. 06/07/17   Marcelyn Bruins, MD  fluticasone Aleda Grana) 50 MCG/ACT nasal spray Use 1-2 sprays per nostril once daily for nasal congestion 06/07/17   Marcelyn Bruins, MD  hydrocortisone cream 1 % Apply 1 application topically daily. 06/07/17   Marcelyn Bruins, MD  ibuprofen (ADVIL,MOTRIN) 800 MG tablet Take 1 tablet (800 mg total) by mouth 3 (three) times daily. 10/30/16   Deatra Canter, FNP  meloxicam (MOBIC) 7.5 MG tablet Take 7.5 mg by mouth daily.    [provider]  Olopatadine HCl (PAZEO) 0.7 % SOLN Place 1 drop into both eyes daily. 06/07/17   Marcelyn Bruins, MD  triamcinolone cream (KENALOG) 0.1 % Apply sparingly once to twice a day as needed for eczema not > 7 days 04/20/17   [provider]    Allergies    Banana and Peach [prunus persica]  Review of Systems   Review of Systems  Constitutional: Negative for chills and fever.  HENT: Negative for congestion and sore throat.   Respiratory: Negative for cough and shortness of breath.   Cardiovascular:  Negative for chest pain and leg swelling.  Gastrointestinal: Negative for abdominal pain, diarrhea and nausea.  Genitourinary: Negative for dysuria and enuresis.  Musculoskeletal: Negative for back pain.       Left knee pain.  Skin: Negative for rash.       Patient has a laceration on the right anterior medial side of the distal part of her leg.  Denies any pain in ankle or foot.  Neurological: Negative for dizziness.  Hematological: Does not bruise/bleed easily.    Physical Exam Updated Vital Signs BP (!) 144/104 (BP Location: Left Arm)   Pulse 73   Temp 98.5 F (36.9 C) (Oral)   Resp 15   Ht 5\' 4"  (1.626 m)   Wt 90.7 kg   LMP 08/02/2019 (Within Days)   SpO2 96%   BMI 34.33 kg/m    Physical Exam Vitals and nursing note reviewed.  Constitutional:      General: She is not in acute distress.    Appearance: She is not ill-appearing.  HENT:     Head: Normocephalic and atraumatic.     Nose: No congestion.     Mouth/Throat:     Mouth: Mucous membranes are moist.     Pharynx: Oropharynx is clear.  Eyes:     General: No scleral icterus. Cardiovascular:     Rate and Rhythm: Normal rate and regular rhythm.     Pulses: Normal pulses.     Heart sounds: No murmur. No friction rub. No gallop.   Pulmonary:     Effort: No respiratory distress.     Breath sounds: No wheezing, rhonchi or rales.  Abdominal:     General: There is no distension.     Tenderness: There is no abdominal tenderness. There is no guarding.  Musculoskeletal:        General: No swelling.     Comments: Patient has a V shaped laceration on the right distal anterior medial side of her leg.  There was no edema noted, no numbness or pain noted around her ankle or foot.  She was able to articulate her toes and ankle without difficulty.  Patient's left knee was examined, there was no rash, abnormalities, and did not appear warm to touch.  There was no cords felt.  Skin:    General: Skin is warm and dry.     Findings: No rash.  Neurological:     Mental Status: She is alert.  Psychiatric:        Mood and Affect: Mood normal.     ED Results / Procedures / Treatments   Labs (all labs ordered are listed, but only abnormal results are displayed) Labs Reviewed - No data to display  EKG None  Radiology DG Knee 2 Views Left  Result Date: 08/08/2019 CLINICAL DATA:  Knee pain following injury 3 weeks ago, initial encounter EXAM: LEFT KNEE - 2 VIEW COMPARISON:  None. FINDINGS: No evidence of fracture, dislocation, or joint effusion. No evidence of arthropathy or other focal bone abnormality. Soft tissues are unremarkable. IMPRESSION: No acute abnormality noted. Electronically Signed   By: Inez Catalina M.D.    On: 08/08/2019 10:43    Procedures .Marland KitchenLaceration Repair  Date/Time: 08/08/2019 1:34 PM Performed by: Marcello Fennel, PA-C Authorized by: Marcello Fennel, PA-C   Consent:    Consent obtained:  Verbal   Consent given by:  Patient   Risks discussed:  Infection, pain, retained foreign body, need for additional repair, poor cosmetic result, nerve damage, poor  wound healing and vascular damage   Alternatives discussed:  No treatment Anesthesia (see MAR for exact dosages):    Anesthesia method:  Local infiltration   Local anesthetic:  Lidocaine 2% WITH epi Laceration details:    Location:  Leg   Leg location:  R lower leg   Length (cm):  8   Depth (mm):  3 Repair type:    Repair type:  Simple Pre-procedure details:    Preparation:  Patient was prepped and draped in usual sterile fashion Exploration:    Contaminated: no   Treatment:    Area cleansed with:  Saline   Amount of cleaning:  Standard   Irrigation solution:  Sterile saline   Irrigation method:  Syringe   Visualized foreign bodies/material removed: no   Skin repair:    Repair method:  Sutures   Suture size:  4-0   Suture material:  Nylon   Suture technique:  Simple interrupted   Number of sutures:  8 Approximation:    Approximation:  Loose Post-procedure details:    Dressing:  Non-adherent dressing   Patient tolerance of procedure:  Tolerated well, no immediate complications   (including critical care time)  Medications Ordered in ED Medications  lidocaine-EPINEPHrine (XYLOCAINE W/EPI) 2 %-1:200000 (PF) injection 10 mL (has no administration in time range)  Tdap (BOOSTRIX) injection 0.5 mL (has no administration in time range)  LORazepam (ATIVAN) tablet 1 mg (has no administration in time range)    ED Course  I have reviewed the triage vital signs and the nursing notes.  Pertinent labs & imaging results that were available during my care of the patient were reviewed by me and considered in my medical  decision making (see chart for details).    MDM Rules/Calculators/A&P                     Personally reviewed the patient's x-ray which did not show any abnormalities.  Patient does not appear to be in any acute distress, she is resting comfortably in bed, and is breathing without difficulty.  Patient has a V-shaped laceration on the distal part of her right leg.  The laceration was visualized and there is no signs of foreign objects in it.  There was no tendons noted and she had full sensation in her foot.  Patient has full range of motion in her ankle and toes.  It is unlikely that the patient has suffered a fracture.  Patient's last tetanus was in 2013 and  was given a Tdap shot. it is recommended that laceration is repaired.  Patient is anxious about the procedure so she was given 1 mg of Ativan.  Patient will not be driving, her son is present and is able to drive.  Patient tolerated the procedure well and was instructed not to shower for 1 day.  She should follow-up with her PCP in 10 days for suture removal.   patient's left knee is able to ambulate without difficulty, she is able to bear weight on it, there was no visible abnormalities noted.  It is possible pain is muscular in nature.  It is recommended that the patient follows up with her primary care doctor for further evaluation and treatment.  She is also instructed that she could take Tylenol and ibuprofen as instructed on the bottle for pain.  Final Clinical Impression(s) / ED Diagnoses Final diagnoses:  None    Rx / DC Orders ED Discharge Orders    None  Carroll Sage, PA-C 08/08/19 1346    Pricilla Loveless, MD 08/11/19 [phone]

## 2019-08-08 NOTE — ED Triage Notes (Signed)
Pt reports closing the car door on her right leg. Laceration to right leg. Also endorses left sided knee pain over a few months, not related to car door.

## 2020-03-01 ENCOUNTER — Other Ambulatory Visit: Payer: Self-pay

## 2020-03-01 ENCOUNTER — Ambulatory Visit (HOSPITAL_COMMUNITY)
Admission: EM | Admit: 2020-03-01 | Discharge: 2020-03-01 | Disposition: A | Discharge status: Home / Self Care | Payer: Medicaid Other | Attending: Internal Medicine | Admitting: Internal Medicine

## 2020-03-01 ENCOUNTER — Encounter (HOSPITAL_COMMUNITY): Payer: Self-pay

## 2020-03-01 DIAGNOSIS — K047 Periapical abscess without sinus: Secondary | ICD-10-CM

## 2020-03-01 DIAGNOSIS — L2084 Intrinsic (allergic) eczema: Secondary | ICD-10-CM

## 2020-03-01 MED ORDER — TRIAMCINOLONE ACETONIDE 0.1 % EX CREA: 1.0000 "application " | TOPICAL_CREAM | Freq: Two times a day (BID) | CUTANEOUS | 0 refills | Status: DC

## 2020-03-01 MED ORDER — IBUPROFEN 600 MG PO TABS: 600.0000 mg | ORAL_TABLET | Freq: Four times a day (QID) | ORAL | 0 refills | Status: DC | PRN

## 2020-03-01 MED ORDER — PREDNISONE 20 MG PO TABS: 20.0000 mg | ORAL_TABLET | Freq: Every day | ORAL | 0 refills | Status: AC

## 2020-03-01 MED ORDER — AMOXICILLIN-POT CLAVULANATE 875-125 MG PO TABS: 1.0000 | ORAL_TABLET | Freq: Two times a day (BID) | ORAL | 0 refills | Status: DC

## 2020-03-01 NOTE — ED Provider Notes (Signed)
[facility]    CSN: [id number] Arrival date & time: 03/01/20  1922      History   Chief Complaint Chief Complaint  Patient presents with  . Eczema  . Dental Pain    HPI Kylie Nicholson is a 49 y.o. female with a history of eczema comes to urgent care with worsening skin rash and itchiness.  Symptoms started several days ago and has been persistent.  She has tried home remedies with no improvement in her symptoms.  In previous episodes of eczema flareup patient has used topical steroids with improvement in her symptoms.  No fever or chills.  No erythema.  Patient also complains of tooth ache of several weeks.  Patient has dental cavity in the right maxillary premolar.  Over the past few days the pain has worsened.  It is currently of moderate severity, throbbing and associated with swelling of the gum.  No fever or chills.  No discharge from the tooth.Marland Kitchen   HPI  Past Medical History:  Diagnosis Date  . Eczema     There are no problems to display for this patient.   Past Surgical History:  Procedure Laterality Date  . KNEE SURGERY      OB History   No obstetric history on file.      Home Medications    Prior to Admission medications   Medication Sig Start Date End Date Taking? Authorizing Provider  albuterol (PROVENTIL HFA;VENTOLIN HFA) 108 (90 Base) MCG/ACT inhaler Inhale 1-2 puffs into the lungs every 4 (four) hours as needed for wheezing or shortness of breath.  11/09/16   [provider]  amoxicillin-clavulanate (AUGMENTIN) 875-125 MG tablet Take 1 tablet by mouth every 12 (twelve) hours. 03/01/20   Merrilee Jansky, MD  ibuprofen (ADVIL) 600 MG tablet Take 1 tablet (600 mg total) by mouth every 6 (six) hours as needed. 03/01/20   Lizet Kelso, Britta Mccreedy, MD  predniSONE (DELTASONE) 20 MG tablet Take 1 tablet (20 mg total) by mouth daily for 5 days. 03/01/20 03/06/20  Merrilee Jansky, MD  triamcinolone (KENALOG) 0.1 % Apply 1 application topically 2  (two) times daily. 03/01/20   Merrilee Jansky, MD  fluticasone Aleda Grana) 50 MCG/ACT nasal spray Use 1-2 sprays per nostril once daily for nasal congestion Patient not taking: Reported on 08/08/2019 06/07/17 03/01/20  Marcelyn Bruins, MD    Family History Family History  Problem Relation Age of Onset  . Asthma Son   . Asthma Son   . Allergic rhinitis Son   . Asthma Son   . Angioedema Neg Hx   . Eczema Neg Hx   . Urticaria Neg Hx     Social History Social History   Tobacco Use  . Smoking status: Current Every Day Smoker    Packs/day: 0.50    Types: Cigarettes  . Smokeless tobacco: Never Used  Vaping Use  . Vaping Use: Never used  Substance Use Topics  . Alcohol use: Yes    Alcohol/week: 1.0 standard drink    Types: 1 Shots of liquor per week    Comment: occ  . Drug use: Yes    Types: Marijuana     Allergies   Banana and Peach [prunus persica]   Review of Systems Review of Systems  Constitutional: Negative.   HENT: Positive for dental problem.   Respiratory: Negative for cough, shortness of breath and wheezing.   Cardiovascular: Negative.   Gastrointestinal: Negative.      Physical Exam Triage Vital  Signs ED Triage Vitals  Enc Vitals Group     BP 03/01/20 1953 117/74     Pulse Rate 03/01/20 1953 61     Resp 03/01/20 1953 18     Temp 03/01/20 1953 97.9 F (36.6 C)     Temp Source 03/01/20 1953 Oral     SpO2 03/01/20 1953 97 %     Weight --      Height --      Head Circumference --      Peak Flow --      Pain Score 03/01/20 1952 7     Pain Loc --      Pain Edu? --      Excl. in GC? --    No data found.  Updated Vital Signs BP 117/74 (BP Location: Right Arm)   Pulse 61   Temp 97.9 F (36.6 C) (Oral)   Resp 18   LMP 02/22/2020   SpO2 97%   Visual Acuity Right Eye Distance:   Left Eye Distance:   Bilateral Distance:    Right Eye Near:   Left Eye Near:    Bilateral Near:     Physical Exam Vitals and nursing note reviewed.   Constitutional:      General: She is not in acute distress.    Appearance: She is not ill-appearing.  Skin:    Comments: Generalized eczematous rash over the face, neck, flexural areas as well as the torso.  No erythema.  No rash.  No weeping of the skin.  Neurological:     Mental Status: She is alert.      UC Treatments / Results  Labs (all labs ordered are listed, but only abnormal results are displayed) Labs Reviewed - No data to display  EKG   Radiology No results found.  Procedures Procedures (including critical care time)  Medications Ordered in UC Medications - No data to display  Initial Impression / Assessment and Plan / UC Course  I have reviewed the triage vital signs and the nursing notes.  Pertinent labs & imaging results that were available during my care of the patient were reviewed by me and considered in my medical decision making (see chart for details).     1.  Generalized eczematous rash: Triamcinolone cream Short course of prednisone Continue calamine lotion Liberal application of emollients If you notice any redness, fever or chills please return to urgent care to be reevaluated.  2.  Dental infection: Augmentin 875-125 mg twice daily for 7 days Ibuprofen 600 mg every 6 hours as needed for pain Dental resources have been included in your packet-please make an appointment to be seen in the dental office for dental work. Final Clinical Impressions(s) / UC Diagnoses   Final diagnoses:  Intrinsic eczema  Dental infection     Discharge Instructions     Please use medications as directed Please make an appointment with the dentist to have dental work done. Return if you have any worsening symptoms.   ED Prescriptions    Medication Sig Dispense Auth. Provider   amoxicillin-clavulanate (AUGMENTIN) 875-125 MG tablet Take 1 tablet by mouth every 12 (twelve) hours. 14 tablet Cashlynn Yearwood, Britta Mccreedy, MD   triamcinolone (KENALOG) 0.1 % Apply 1  application topically 2 (two) times daily. 80 g Merrilee Jansky, MD   predniSONE (DELTASONE) 20 MG tablet Take 1 tablet (20 mg total) by mouth daily for 5 days. 5 tablet Kosei Rhodes, Britta Mccreedy, MD   ibuprofen (ADVIL) 600 MG tablet  Take 1 tablet (600 mg total) by mouth every 6 (six) hours as needed. 30 tablet Berdell Nevitt, Britta Mccreedy, MD     PDMP not reviewed this encounter.   Merrilee Jansky, MD 03/01/20 2105

## 2020-03-01 NOTE — Discharge Instructions (Addendum)
Please use medications as directed Please make an appointment with the dentist to have dental work done. Return if you have any worsening symptoms.

## 2020-03-01 NOTE — ED Triage Notes (Signed)
Pt presents for her chronic eczema issue; pt states she is having a flare.  Pt also presents for left side tooth pain X several weeks.

## 2020-03-11 DIAGNOSIS — F17210 Nicotine dependence, cigarettes, uncomplicated: Secondary | ICD-10-CM | POA: Insufficient documentation

## 2020-03-11 DIAGNOSIS — Y92410 Unspecified street and highway as the place of occurrence of the external cause: Secondary | ICD-10-CM | POA: Insufficient documentation

## 2020-03-11 DIAGNOSIS — M25519 Pain in unspecified shoulder: Secondary | ICD-10-CM | POA: Diagnosis not present

## 2020-03-11 DIAGNOSIS — M542 Cervicalgia: Secondary | ICD-10-CM | POA: Diagnosis not present

## 2020-03-12 ENCOUNTER — Encounter (HOSPITAL_COMMUNITY): Payer: Self-pay

## 2020-03-12 ENCOUNTER — Emergency Department (HOSPITAL_COMMUNITY): Payer: No Typology Code available for payment source

## 2020-03-12 ENCOUNTER — Emergency Department (HOSPITAL_COMMUNITY)
Admission: EM | Admit: 2020-03-12 | Discharge: 2020-03-12 | Disposition: A | Discharge status: Home / Self Care | Payer: No Typology Code available for payment source | Attending: Emergency Medicine | Admitting: Emergency Medicine

## 2020-03-12 ENCOUNTER — Other Ambulatory Visit: Payer: Self-pay
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
## 2020-06-26 ENCOUNTER — Ambulatory Visit (HOSPITAL_COMMUNITY)
Admission: EM | Admit: 2020-06-26 | Discharge: 2020-06-26 | Disposition: A | Discharge status: Home / Self Care | Payer: Medicaid Other | Attending: Family Medicine | Admitting: Family Medicine

## 2020-06-26 ENCOUNTER — Other Ambulatory Visit: Payer: Self-pay

## 2020-06-26 ENCOUNTER — Encounter (HOSPITAL_COMMUNITY): Payer: Self-pay

## 2020-06-26 DIAGNOSIS — L309 Dermatitis, unspecified: Secondary | ICD-10-CM

## 2020-06-26 DIAGNOSIS — R220 Localized swelling, mass and lump, head: Secondary | ICD-10-CM

## 2020-06-26 DIAGNOSIS — T7840XA Allergy, unspecified, initial encounter: Secondary | ICD-10-CM

## 2020-06-26 MED ORDER — METHYLPREDNISOLONE SODIUM SUCC 125 MG IJ SOLR
INTRAMUSCULAR | Status: AC
  Filled 2020-06-26: qty 2

## 2020-06-26 MED ORDER — TRIAMCINOLONE ACETONIDE 0.1 % EX CREA: 1.0000 "application " | TOPICAL_CREAM | Freq: Two times a day (BID) | CUTANEOUS | 0 refills | Status: DC | PRN

## 2020-06-26 MED ORDER — METHYLPREDNISOLONE SODIUM SUCC 125 MG IJ SOLR
80.0000 mg | Freq: Once | INTRAMUSCULAR | Status: AC
  Administered 2020-06-26: 80 mg via INTRAMUSCULAR

## 2020-06-26 NOTE — ED Triage Notes (Addendum)
Pt in with c/o allergic reaction that occurred 2 days ago after she got air freshener on her face. Also c/o blurred vision  Pt states she noticed swelling under her eyes and she started using Eucerin lotion for sxs

## 2020-06-26 NOTE — ED Provider Notes (Signed)
[facility]    CSN: [id number] Arrival date & time: 06/26/20  1719      History   Chief Complaint Chief Complaint  Patient presents with  . Allergic Reaction    HPI Kylie Nicholson is a 50 y.o. female.   Patient here today with 3-day history of facial itching, swelling, rash that is now also on bilateral arms and chest.  States the symptoms started whenever she was exposed to air freshener solution 3 days ago.  No history of very sensitive skin, eczema and frequently reacts to chemicals such as this.  She is completely out of steroid creams at home so has been using Eucerin cream which has helped mildly.  The worse with the swelling and the rash is below her eyes down to her cheeks bilaterally.  Denies visual changes or globe issues, throat swelling, wheezing, chest tightness, trouble breathing.     Past Medical History:  Diagnosis Date  . Eczema     There are no problems to display for this patient.   Past Surgical History:  Procedure Laterality Date  . KNEE SURGERY      OB History   No obstetric history on file.      Home Medications    Prior to Admission medications   Medication Sig Start Date End Date Taking? Authorizing Provider  albuterol (PROVENTIL HFA;VENTOLIN HFA) 108 (90 Base) MCG/ACT inhaler Inhale 1-2 puffs into the lungs every 4 (four) hours as needed for wheezing or shortness of breath.  11/09/16   [provider]  amoxicillin-clavulanate (AUGMENTIN) 875-125 MG tablet Take 1 tablet by mouth every 12 (twelve) hours. 03/01/20   Merrilee Jansky, MD  ibuprofen (ADVIL) 600 MG tablet Take 1 tablet (600 mg total) by mouth every 6 (six) hours as needed. 03/01/20   Lamptey, Britta Mccreedy, MD  triamcinolone (KENALOG) 0.1 % Apply 1 application topically 2 (two) times daily as needed. 06/26/20   Particia Nearing, PA-C  fluticasone [facility]) 50 MCG/ACT nasal spray Use 1-2 sprays per nostril once daily for nasal congestion Patient not taking:  Reported on 08/08/2019 06/07/17 03/01/20  Marcelyn Bruins, MD    Family History Family History  Problem Relation Age of Onset  . Asthma Son   . Asthma Son   . Allergic rhinitis Son   . Asthma Son   . Angioedema Neg Hx   . Eczema Neg Hx   . Urticaria Neg Hx     Social History Social History   Tobacco Use  . Smoking status: Current Every Day Smoker    Packs/day: 0.50    Types: Cigarettes  . Smokeless tobacco: Never Used  Vaping Use  . Vaping Use: Never used  Substance Use Topics  . Alcohol use: Yes    Alcohol/week: 1.0 standard drink    Types: 1 Shots of liquor per week    Comment: occ  . Drug use: Yes    Types: Marijuana     Allergies   Banana, Peach [prunus persica], and Other   Review of Systems Review of Systems Per HPI  Physical Exam Triage Vital Signs ED Triage Vitals  Enc Vitals Group     BP 06/26/20 1730 138/72     Pulse Rate 06/26/20 1730 80     Resp 06/26/20 1730 18     Temp --      Temp src --      SpO2 06/26/20 1730 99 %     Weight --  Height --      Head Circumference --      Peak Flow --      Pain Score 06/26/20 1727 0     Pain Loc --      Pain Edu? --      Excl. in GC? --    No data found.  Updated Vital Signs BP 138/72   Pulse 80   Resp 18   LMP 05/27/2020 (Approximate)   SpO2 99%   Visual Acuity Right Eye Distance: 20/50 Left Eye Distance: 20/70 Bilateral Distance: 20/50  Right Eye Near:   Left Eye Near:    Bilateral Near:     Physical Exam Vitals and nursing note reviewed.  Constitutional:      Appearance: Normal appearance. She is not ill-appearing.  HENT:     Head: Atraumatic.     Nose: Nose normal.     Mouth/Throat:     Mouth: Mucous membranes are moist.     Pharynx: Oropharynx is clear.  Eyes:     General:        Right eye: No discharge.        Left eye: No discharge.     Extraocular Movements: Extraocular movements intact.     Conjunctiva/sclera: Conjunctivae normal.     Pupils: Pupils are  equal, round, and reactive to light.  Cardiovascular:     Rate and Rhythm: Normal rate and regular rhythm.     Heart sounds: Normal heart sounds.  Pulmonary:     Effort: Pulmonary effort is normal.     Breath sounds: Normal breath sounds.  Musculoskeletal:        General: Normal range of motion.     Cervical back: Normal range of motion and neck supple.  Skin:    General: Skin is warm and dry.     Findings: Rash present.     Comments: Hyperpigmented rash in patches across upper extremities bilaterally and chest, neck Bilateral cheeks and under eyes erythematous and edematous with hyperpigmented rash  Neurological:     Mental Status: She is alert and oriented to person, place, and time.  Psychiatric:        Mood and Affect: Mood normal.        Thought Content: Thought content normal.        Judgment: Judgment normal.      UC Treatments / Results  Labs (all labs ordered are listed, but only abnormal results are displayed) Labs Reviewed - No data to display  EKG   Radiology No results found.  Procedures Procedures (including critical care time)  Medications Ordered in UC Medications  methylPREDNISolone sodium succinate (SOLU-MEDROL) 125 mg/2 mL injection 80 mg (has no administration in time range)    Initial Impression / Assessment and Plan / UC Course  I have reviewed the triage vital signs and the nursing notes.  Pertinent labs & imaging results that were available during my care of the patient were reviewed by me and considered in my medical decision making (see chart for details).     Eczema flare from allergic exposure.  Will treat with IM Solu-Medrol in clinic, triamcinolone to all the facial rash, hydrocortisone mixed with her moisturizer cream for the facial rash.  Follow-up with primary care for recheck and further refills.  Avoid scented products, hot water, other irritants.  Final Clinical Impressions(s) / UC Diagnoses   Final diagnoses:  Allergic  reaction, initial encounter  Facial swelling  Eczema, unspecified type   Discharge Instructions  None    ED Prescriptions    Medication Sig Dispense Auth. Provider   triamcinolone (KENALOG) 0.1 % Apply 1 application topically 2 (two) times daily as needed. 453.6 g Particia Nearing, [place]     PDMP not reviewed this encounter.   Particia Nearing, [place] 06/26/20 1800

## 2020-10-21 ENCOUNTER — Encounter (HOSPITAL_COMMUNITY): Payer: Self-pay

## 2020-10-21 ENCOUNTER — Other Ambulatory Visit: Payer: Self-pay

## 2020-10-21 ENCOUNTER — Ambulatory Visit (HOSPITAL_COMMUNITY)
Admission: EM | Admit: 2020-10-21 | Discharge: 2020-10-21 | Disposition: A | Discharge status: Home / Self Care | Payer: Self-pay | Attending: Internal Medicine | Admitting: Internal Medicine

## 2020-10-21 DIAGNOSIS — M5431 Sciatica, right side: Secondary | ICD-10-CM

## 2020-10-21 MED ORDER — TIZANIDINE HCL 4 MG PO TABS: 4.0000 mg | ORAL_TABLET | Freq: Every evening | ORAL | 0 refills | Status: DC | PRN

## 2020-10-21 MED ORDER — NAPROXEN 375 MG PO TABS: 375.0000 mg | ORAL_TABLET | Freq: Two times a day (BID) | ORAL | 0 refills | Status: DC

## 2020-10-21 NOTE — Discharge Instructions (Addendum)
Gentle range of motion exercises for the lower back Take medications as prescribed If symptoms persist, you may benefit from orthopedic surgery referral. Return to urgent care if symptoms worsen or persist.

## 2020-10-21 NOTE — ED Notes (Signed)
Attempt to call from lobby. 

## 2020-10-21 NOTE — ED Triage Notes (Signed)
Pt presents with right leg pain from right hip/buttocks area that radiates down to knee X 1 month.

## 2020-10-22 NOTE — ED Provider Notes (Signed)
[facility]    CSN: [id number] Arrival date & time: 10/21/20  1932      History   Chief Complaint Chief Complaint  Patient presents with   Leg Pain    HPI Kylie Nicholson is a 50 y.o. female comes to the urgent care with 1 month history of right hip/right gluteal pain radiating to the knee.  Patient says symptoms started with lower back pain followed by radiation of the pain into the right hip and buttocks.  No known aggravating or relieving factors.  Pain is currently a 10 out of 10.  She denies any weakness in the right leg.  No numbness or tingling in the right leg.  No skin rash.  No trauma or falls to the back.  Patient has tried ibuprofen with no significant improvement in the symptoms. HPI  Past Medical History:  Diagnosis Date   Eczema     There are no problems to display for this patient.   Past Surgical History:  Procedure Laterality Date   KNEE SURGERY      OB History   No obstetric history on file.      Home Medications    Prior to Admission medications   Medication Sig Start Date End Date Taking? Authorizing Provider  albuterol (PROVENTIL HFA;VENTOLIN HFA) 108 (90 Base) MCG/ACT inhaler Inhale 1-2 puffs into the lungs every 4 (four) hours as needed for wheezing or shortness of breath.  11/09/16   [provider]  ibuprofen (ADVIL) 600 MG tablet Take 1 tablet (600 mg total) by mouth every 6 (six) hours as needed. 03/01/20   Merrilee Jansky, MD  naproxen (NAPROSYN) 375 MG tablet Take 1 tablet (375 mg total) by mouth 2 (two) times daily. 10/21/20   Deeric Cruise, Britta Mccreedy, MD  tiZANidine (ZANAFLEX) 4 MG tablet Take 1 tablet (4 mg total) by mouth at bedtime as needed for muscle spasms. 10/21/20   Mayank Teuscher, Britta Mccreedy, MD  triamcinolone (KENALOG) 0.1 % Apply 1 application topically 2 (two) times daily as needed. 06/26/20   Particia Nearing, PA-C  fluticasone [facility]) 50 MCG/ACT nasal spray Use 1-2 sprays per nostril once daily for nasal  congestion Patient not taking: Reported on 08/08/2019 06/07/17 03/01/20  Marcelyn Bruins, MD    Family History Family History  Problem Relation Age of Onset   Asthma Son    Asthma Son    Allergic rhinitis Son    Asthma Son    Angioedema Neg Hx    Eczema Neg Hx    Urticaria Neg Hx     Social History Social History   Tobacco Use   Smoking status: Every Day    Packs/day: 0.50    Types: Cigarettes   Smokeless tobacco: Never  Vaping Use   Vaping Use: Never used  Substance Use Topics   Alcohol use: Yes    Alcohol/week: 1.0 standard drink    Types: 1 Shots of liquor per week    Comment: occ   Drug use: Yes    Types: Marijuana     Allergies   Banana, Peach [prunus persica], and Other   Review of Systems Review of Systems  Respiratory: Negative.    Gastrointestinal: Negative.   Musculoskeletal:  Positive for arthralgias and back pain. Negative for joint swelling.  Skin: Negative.     Physical Exam Triage Vital Signs ED Triage Vitals  Enc Vitals Group     BP 10/21/20 2026 (!) 166/91     Pulse Rate 10/21/20 2026  65     Resp 10/21/20 2026 17     Temp 10/21/20 2026 98 F (36.7 C)     Temp Source 10/21/20 2026 Oral     SpO2 10/21/20 2026 100 %     Weight --      Height --      Head Circumference --      Peak Flow --      Pain Score 10/21/20 2024 10     Pain Loc --      Pain Edu? --      Excl. in GC? --    No data found.  Updated Vital Signs BP (!) 166/91 (BP Location: Right Arm)   Pulse 65   Temp 98 F (36.7 C) (Oral)   Resp 17   LMP 10/07/2020   SpO2 100%   Visual Acuity Right Eye Distance:   Left Eye Distance:   Bilateral Distance:    Right Eye Near:   Left Eye Near:    Bilateral Near:     Physical Exam Vitals and nursing note reviewed.  Constitutional:      General: She is not in acute distress.    Appearance: She is not ill-appearing.  Cardiovascular:     Rate and Rhythm: Normal rate and regular rhythm.  Musculoskeletal:         General: No swelling, tenderness, deformity or signs of injury. Normal range of motion.  Skin:    General: Skin is warm.     Capillary Refill: Capillary refill takes less than 2 seconds.     Coloration: Skin is not jaundiced.     Findings: No bruising.  Neurological:     Mental Status: She is alert.     Deep Tendon Reflexes: Reflexes normal.     UC Treatments / Results  Labs (all labs ordered are listed, but only abnormal results are displayed) Labs Reviewed - No data to display  EKG   Radiology No results found.  Procedures Procedures (including critical care time)  Medications Ordered in UC Medications - No data to display  Initial Impression / Assessment and Plan / UC Course  I have reviewed the triage vital signs and the nursing notes.  Pertinent labs & imaging results that were available during my care of the patient were reviewed by me and considered in my medical decision making (see chart for details).     1.  Sciatica of the right side: Discontinue ibuprofen use Start naproxen 375 mg twice daily as needed for pain Tizanidine 4 mg at bedtime as needed for muscle stiffness. Gentle range of motion exercises  If symptoms worsen, patient will need orthopedic surgery evaluation. Final Clinical Impressions(s) / UC Diagnoses   Final diagnoses:  Sciatica of right side     Discharge Instructions      Gentle range of motion exercises for the lower back Take medications as prescribed If symptoms persist, you may benefit from orthopedic surgery referral. Return to urgent care if symptoms worsen or persist.   ED Prescriptions     Medication Sig Dispense Auth. Provider   naproxen (NAPROSYN) 375 MG tablet  (Status: Discontinued) Take 1 tablet (375 mg total) by mouth 2 (two) times daily. 30 tablet Aldrin Engelhard, Britta Mccreedy, MD   tiZANidine (ZANAFLEX) 4 MG tablet  (Status: Discontinued) Take 1 tablet (4 mg total) by mouth at bedtime as needed for muscle spasms. 20  tablet Westley Blass, Britta Mccreedy, MD   naproxen (NAPROSYN) 375 MG tablet Take 1 tablet (375 mg  total) by mouth 2 (two) times daily. 30 tablet Latroy Gaymon, Britta Mccreedy, MD   tiZANidine (ZANAFLEX) 4 MG tablet Take 1 tablet (4 mg total) by mouth at bedtime as needed for muscle spasms. 20 tablet Ayaat Jansma, Britta Mccreedy, MD      PDMP not reviewed this encounter.   Merrilee Jansky, MD 10/22/20 [phone]

## 2020-11-18 ENCOUNTER — Other Ambulatory Visit: Payer: Self-pay

## 2020-11-18 ENCOUNTER — Ambulatory Visit (HOSPITAL_COMMUNITY)
Admission: EM | Admit: 2020-11-18 | Discharge: 2020-11-18 | Disposition: A | Discharge status: Home / Self Care | Payer: Medicaid Other | Attending: Family Medicine | Admitting: Family Medicine

## 2020-11-18 ENCOUNTER — Encounter (HOSPITAL_COMMUNITY): Payer: Self-pay | Admitting: Emergency Medicine

## 2020-11-18 DIAGNOSIS — M5441 Lumbago with sciatica, right side: Secondary | ICD-10-CM

## 2020-11-18 DIAGNOSIS — H1013 Acute atopic conjunctivitis, bilateral: Secondary | ICD-10-CM

## 2020-11-18 MED ORDER — OLOPATADINE HCL 0.2 % OP SOLN: 1.0000 [drp] | Freq: Every day | OPHTHALMIC | 0 refills | Status: DC

## 2020-11-18 MED ORDER — PREDNISONE 20 MG PO TABS: 40.0000 mg | ORAL_TABLET | Freq: Every day | ORAL | 0 refills | Status: DC

## 2020-11-18 NOTE — ED Triage Notes (Signed)
PT reports bilateral eye itching. States she is allergic to many concentrated sprays that irritate eyes

## 2020-11-18 NOTE — ED Provider Notes (Signed)
[facility]   [id number] 11/18/20 Arrival Time: 1217  ASSESSMENT & PLAN:  1. Allergic conjunctivitis of both eyes   2. Acute right-sided low back pain with right-sided sciatica     Able to ambulate here and hemodynamically stable. No indication for imaging of back at this time given no trauma and normal neurological exam. Discussed. No signs of bacterial conjunctivitis.  Begin: Meds ordered this encounter  Medications   predniSONE (DELTASONE) 20 MG tablet    Sig: Take 2 tablets (40 mg total) by mouth daily.    Dispense:  10 tablet    Refill:  0   Olopatadine HCl 0.2 % SOLN    Sig: Apply 1 drop to eye daily.    Dispense:  2.5 mL    Refill:  0   Encourage ROM/movement as tolerated. Work note provided.  Recommend:  Follow-up Information     Dolan Amen, FNP.   Specialty: Nurse Practitioner Why: If worsening or failing to improve as anticipated. Contact information: [address]issa [address] [place] [address] [phone]                 Reviewed expectations re: course of current medical issues. Questions answered. Outlined signs and symptoms indicating need for more acute intervention. Patient verbalized understanding. After Visit Summary given.   SUBJECTIVE: History from: patient.  Kylie Nicholson is a 50 y.o. female who was recently seen for R sciatica.presents with complaint of R-sided LBP with radiation to R post leg; seen recently; Naprosyn helped a little; still with symptoms. No sensation loss or muscle weakness. Normal bowel/bladder habits. Also reports bilateral eye irritation and watery drainage; itching; happens with certain air fresheners sprays; recently exposed. No vision changes.  OBJECTIVE:  Vitals:   11/18/20 1339  BP: (!) 156/95  Pulse: 60  Resp: 18  Temp: 98.4 F (36.9 C)  SpO2: 100%    General appearance: alert; no distress HEENT: [place]; AT; bilateral mild conjunctival injection with watery drainage; EOMI  without pain; PERRLA Neck: supple with FROM; without midline tenderness CV: regular Lungs: unlabored respirations; speaks full sentences without difficulty Abdomen: soft, non-tender; non-distended Back: mild soreness with exam of lower R back/upper buttock; R SLR with reported pain; FROM at waist; bruising: none; without midline tenderness Extremities: without edema; symmetrical without gross deformities; normal ROM of bilateral LE Skin: warm and dry Neurologic: normal gait; normal sensation and strength of bilateral LE Psychological: alert and cooperative; normal mood and affect   Allergies  Allergen Reactions   Banana Anaphylaxis and Itching   Peach [Prunus Persica] Anaphylaxis and Itching   Other     Air [place] Pian    Past Medical History:  Diagnosis Date   Eczema    Social History   Socioeconomic History   Marital status: Single    Spouse name: Not on file   Number of children: Not on file   Years of education: Not on file   Highest education level: Not on file  Occupational History   Not on file  Tobacco Use   Smoking status: Every Day    Packs/day: 0.50    Types: Cigarettes   Smokeless tobacco: Never  Vaping Use   Vaping Use: Never used  Substance and Sexual Activity   Alcohol use: Yes    Alcohol/week: 1.0 standard drink    Types: 1 Shots of liquor per week    Comment: occ   Drug use: Yes    Types: Marijuana   Sexual activity: Yes  Birth control/protection: None  Other Topics Concern   Not on file  Social History Narrative   Not on file   Social Determinants of Health   Financial Resource Strain: Not on file  Food Insecurity: Not on file  Transportation Needs: Not on file  Physical Activity: Not on file  Stress: Not on file  Social Connections: Not on file  Intimate Partner Violence: Not on file   Family History  Problem Relation Age of Onset   Asthma Son    Asthma Son    Allergic rhinitis Son    Asthma Son    Angioedema Neg Hx    Eczema  Neg Hx    Urticaria Neg Hx    Past Surgical History:  Procedure Laterality Date   KNEE SURGERY        Mardella Layman, MD 11/18/20 1404

## 2020-11-18 NOTE — ED Triage Notes (Signed)
PT was seen a few weeks ago for leg pain and naproxen is not helping, requests addtiional tx

## 2020-12-22 ENCOUNTER — Emergency Department (HOSPITAL_COMMUNITY)
Admission: EM | Admit: 2020-12-22 | Discharge: 2020-12-22 | Disposition: A | Discharge status: Home / Self Care | Payer: Medicaid Other | Attending: Emergency Medicine | Admitting: Emergency Medicine

## 2020-12-22 ENCOUNTER — Encounter (HOSPITAL_COMMUNITY): Payer: Self-pay | Admitting: Emergency Medicine

## 2020-12-22 ENCOUNTER — Other Ambulatory Visit: Payer: Self-pay

## 2020-12-22 DIAGNOSIS — M5431 Sciatica, right side: Secondary | ICD-10-CM | POA: Insufficient documentation

## 2020-12-22 DIAGNOSIS — M25551 Pain in right hip: Secondary | ICD-10-CM | POA: Insufficient documentation

## 2020-12-22 DIAGNOSIS — Z5321 Procedure and treatment not carried out due to patient leaving prior to being seen by health care provider: Secondary | ICD-10-CM | POA: Insufficient documentation

## 2020-12-22 DIAGNOSIS — M79651 Pain in right thigh: Secondary | ICD-10-CM | POA: Insufficient documentation

## 2020-12-22 NOTE — ED Notes (Signed)
Pt walked out of her room and stated "y'all call me when the doctor ready to see me, this shit ridiculous"

## 2020-12-22 NOTE — ED Triage Notes (Signed)
Patient complaining of right hip and buttock pain. Patient seen multiple times for same. Patient states pain has not gotten any better with prescribed or over the counter treatments. Patient is ambulatory.

## 2020-12-22 NOTE — ED Notes (Signed)
Pt ambulatory with a steady gait into ED lobby and seen walking out of ED entrance.

## 2021-01-25 ENCOUNTER — Other Ambulatory Visit: Payer: Self-pay

## 2021-01-25 ENCOUNTER — Emergency Department (HOSPITAL_COMMUNITY)
Admission: EM | Admit: 2021-01-25 | Discharge: 2021-01-25 | Disposition: A | Discharge status: Home / Self Care | Payer: Medicaid Other | Attending: Emergency Medicine | Admitting: Emergency Medicine

## 2021-01-25 ENCOUNTER — Encounter (HOSPITAL_COMMUNITY): Payer: Self-pay | Admitting: Emergency Medicine

## 2021-01-25 DIAGNOSIS — F17210 Nicotine dependence, cigarettes, uncomplicated: Secondary | ICD-10-CM | POA: Insufficient documentation

## 2021-01-25 DIAGNOSIS — M5431 Sciatica, right side: Secondary | ICD-10-CM

## 2021-01-25 MED ORDER — METHYLPREDNISOLONE 4 MG PO TBPK: ORAL_TABLET | ORAL | 0 refills | Status: DC

## 2021-01-25 MED ORDER — KETOROLAC TROMETHAMINE 60 MG/2ML IM SOLN
60.0000 mg | Freq: Once | INTRAMUSCULAR | Status: AC
  Administered 2021-01-25: 60 mg via INTRAMUSCULAR
  Filled 2021-01-25: qty 2

## 2021-01-25 MED ORDER — CYCLOBENZAPRINE HCL 10 MG PO TABS: 10.0000 mg | ORAL_TABLET | Freq: Two times a day (BID) | ORAL | 0 refills | Status: DC | PRN
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
## 2021-01-25 NOTE — Discharge Instructions (Addendum)
Take Medrol Dosepak as prescribed.  Afterwards recommend using 400 mg ibuprofen every 8 hours as needed for pain.  Recommend using 1000 mg of Tylenol every 6 hours as needed for pain.  Flexeril is a muscle relaxant, use as needed.  It can be sedating and as discussed and do not use while doing dangerous activities including driving.  Follow-up with sports medicine.

## 2021-01-25 NOTE — ED Provider Notes (Signed)
[facility] EMERGENCY DEPARTMENT Provider Note   CSN: [id number] Arrival date & time: 01/25/21  0732     History Chief Complaint  Patient presents with   Leg Pain    Kylie Nicholson is a 50 y.o. female.  The history is provided by the patient.  Leg Pain Location:  Hip and buttock Time since incident:  5 months Hip location:  R hip Buttock location:  R buttock Pain details:    Quality:  Aching   Severity:  Mild   Onset quality:  Gradual   Timing:  Intermittent   Progression:  Waxing and waning Chronicity:  Chronic Relieved by:  Acetaminophen, arthritis medications and NSAIDs Worsened by:  Activity Associated symptoms: stiffness and tingling   Associated symptoms: no back pain, no decreased ROM, no fatigue, no fever, no itching, no muscle weakness, no neck pain and no swelling       Past Medical History:  Diagnosis Date   Eczema     There are no problems to display for this patient.   Past Surgical History:  Procedure Laterality Date   KNEE SURGERY       OB History   No obstetric history on file.     Family History  Problem Relation Age of Onset   Asthma Son    Asthma Son    Allergic rhinitis Son    Asthma Son    Angioedema Neg Hx    Eczema Neg Hx    Urticaria Neg Hx     Social History   Tobacco Use   Smoking status: Every Day    Packs/day: 0.50    Types: Cigarettes   Smokeless tobacco: Never  Vaping Use   Vaping Use: Never used  Substance Use Topics   Alcohol use: Yes    Alcohol/week: 1.0 standard drink    Types: 1 Shots of liquor per week    Comment: occ   Drug use: Yes    Types: Marijuana    Home Medications Prior to Admission medications   Medication Sig Start Date End Date Taking? Authorizing Provider  cyclobenzaprine (FLEXERIL) 10 MG tablet Take 1 tablet (10 mg total) by mouth 2 (two) times daily as needed for muscle spasms. 01/25/21  Yes Maddon Horton, DO  methylPREDNISolone (MEDROL DOSEPAK) 4 MG TBPK tablet  Follow package insert 01/25/21  Yes Shana Younge, DO  albuterol (PROVENTIL HFA;VENTOLIN HFA) 108 (90 Base) MCG/ACT inhaler Inhale 1-2 puffs into the lungs every 4 (four) hours as needed for wheezing or shortness of breath.  11/09/16   [provider]  Olopatadine HCl 0.2 % SOLN Apply 1 drop to eye daily. 11/18/20   Mardella Layman, MD  triamcinolone (KENALOG) 0.1 % Apply 1 application topically 2 (two) times daily as needed. 06/26/20   Particia Nearing, PA-C  fluticasone [facility]) 50 MCG/ACT nasal spray Use 1-2 sprays per nostril once daily for nasal congestion Patient not taking: Reported on 08/08/2019 06/07/17 03/01/20  Marcelyn Bruins, MD    Allergies    Banana, Peach [prunus persica], and Other  Review of Systems   Review of Systems  Constitutional:  Negative for fatigue and fever.  Genitourinary:  Negative for decreased urine volume, difficulty urinating and frequency.  Musculoskeletal:  Positive for arthralgias, gait problem and stiffness. Negative for back pain, joint swelling, myalgias, neck pain and neck stiffness.  Skin:  Negative for itching and wound.  Neurological:  Negative for weakness and numbness.   Physical Exam Updated Vital Signs BP Marland Kitchen)  151/96 (BP Location: Right Arm)   Pulse 75   Temp 98.2 F (36.8 C) (Oral)   Resp 15   LMP 01/18/2021   SpO2 91%   Physical Exam Constitutional:      General: She is not in acute distress.    Appearance: She is not ill-appearing.  Cardiovascular:     Pulses: Normal pulses.  Musculoskeletal:        General: Tenderness (TTP to right gluteal area) present. No deformity. Normal range of motion.     Cervical back: Normal range of motion. No tenderness.  Skin:    General: Skin is warm.     Capillary Refill: Capillary refill takes less than 2 seconds.  Neurological:     General: No focal deficit present.     Mental Status: She is alert and oriented to person, place, and time.     Cranial Nerves: No cranial  nerve deficit.     Sensory: No sensory deficit.     Motor: No weakness.     Coordination: Coordination normal.     Comments: Ambulates with antalgic gait, 5+ out of 5 strength in bilateral lower extremities, normal sensation    ED Results / Procedures / Treatments   Labs (all labs ordered are listed, but only abnormal results are displayed) Labs Reviewed - No data to display  EKG None  Radiology No results found.  Procedures Procedures   Medications Ordered in ED Medications  ketorolac (TORADOL) injection 60 mg (has no administration in time range)    ED Course  I have reviewed the triage vital signs and the nursing notes.  Pertinent labs & imaging results that were available during my care of the patient were reviewed by me and considered in my medical decision making (see chart for details).    MDM Rules/Calculators/A&P                           Kylie Nicholson is here with right lower leg pain.  Pain mostly in the right gluteal area and shoots down into her foot at times.  On and off for several months.  Has been using over-the-counter medications with minimal relief at times.  Overall sounds like sciatic type pain.  She has normal neurological exam.  Normal strength and sensation.  Normal pulses in her lower legs.  No concern for DVT or peripheral arterial disease.  No symptoms to suggest cauda equina.  Will prescribe Medrol Dosepak, muscle relaxant and have her follow-up with sports medicine.  Discharged in good condition.  This chart was dictated using voice recognition software.  Despite best efforts to proofread,  errors can occur which can change the documentation meaning.   Final Clinical Impression(s) / ED Diagnoses Final diagnoses:  Sciatica of right side    Rx / DC Orders ED Discharge Orders          Ordered    methylPREDNISolone (MEDROL DOSEPAK) 4 MG TBPK tablet        01/25/21 0935    cyclobenzaprine (FLEXERIL) 10 MG tablet  2 times daily PRN         01/25/21 0935             Virgina Norfolk, DO 01/25/21 [phone]

## 2021-04-24 DIAGNOSIS — L2084 Intrinsic (allergic) eczema: Secondary | ICD-10-CM | POA: Diagnosis not present

## 2021-04-24 DIAGNOSIS — Z3009 Encounter for other general counseling and advice on contraception: Secondary | ICD-10-CM | POA: Diagnosis not present

## 2021-04-24 DIAGNOSIS — N751 Abscess of Bartholin's gland: Secondary | ICD-10-CM | POA: Diagnosis not present

## 2021-04-24 DIAGNOSIS — M5431 Sciatica, right side: Secondary | ICD-10-CM | POA: Diagnosis not present

## 2021-05-27 ENCOUNTER — Encounter (HOSPITAL_COMMUNITY): Payer: Self-pay

## 2021-05-27 ENCOUNTER — Other Ambulatory Visit: Payer: Self-pay

## 2021-05-27 ENCOUNTER — Ambulatory Visit (INDEPENDENT_AMBULATORY_CARE_PROVIDER_SITE_OTHER): Payer: PRIVATE HEALTH INSURANCE

## 2021-05-27 ENCOUNTER — Ambulatory Visit (HOSPITAL_COMMUNITY)
Admission: EM | Admit: 2021-05-27 | Discharge: 2021-05-27 | Disposition: A | Discharge status: Home / Self Care | Payer: PRIVATE HEALTH INSURANCE | Attending: Family Medicine | Admitting: Family Medicine

## 2021-05-27 DIAGNOSIS — J4521 Mild intermittent asthma with (acute) exacerbation: Secondary | ICD-10-CM

## 2021-05-27 DIAGNOSIS — R0602 Shortness of breath: Secondary | ICD-10-CM | POA: Diagnosis not present

## 2021-05-27 DIAGNOSIS — R062 Wheezing: Secondary | ICD-10-CM

## 2021-05-27 MED ORDER — ALBUTEROL SULFATE HFA 108 (90 BASE) MCG/ACT IN AERS: 1.0000 | INHALATION_SPRAY | Freq: Four times a day (QID) | RESPIRATORY_TRACT | 1 refills | Status: DC | PRN

## 2021-05-27 MED ORDER — PREDNISONE 20 MG PO TABS: 40.0000 mg | ORAL_TABLET | Freq: Every day | ORAL | 0 refills | Status: DC

## 2021-05-27 NOTE — ED Triage Notes (Signed)
Pt c/o SOB increased over the past 4 days. States SOB is making her cough. States using her sons inhaler. ?

## 2021-05-28 NOTE — ED Provider Notes (Signed)
?[facility] ? ? ?[id number] ?05/27/21 Arrival Time: 1816 ? ?ASSESSMENT & PLAN: ? ?1. SOB (shortness of breath)   ?2. Mild intermittent asthma with acute exacerbation   ?3. Wheezing   ? ?No resp distress. ?I have personally viewed the imaging studies ordered this visit. ?No acute changes on CXR. ? ? ?Begin: ?Meds ordered this encounter  ?Medications  ? predniSONE (DELTASONE) 20 MG tablet  ?  Sig: Take 2 tablets (40 mg total) by mouth daily.  ?  Dispense:  10 tablet  ?  Refill:  0  ? albuterol (VENTOLIN HFA) 108 (90 Base) MCG/ACT inhaler  ?  Sig: Inhale 1-2 puffs into the lungs every 6 (six) hours as needed for wheezing or shortness of breath.  ?  Dispense:  1 each  ?  Refill:  1  ? ?Asthma precautions given. ?OTC symptom care as needed. ? ?Recommend: ? Follow-up Information   ? ? [facility].   ?Specialty: Urgent Care ?Why: If worsening or failing to improve as anticipated. ?Contact information: ?[address] ?[place] [address] ?[phone] ? ?  ?  ? ?  ?  ? ?  ? ? ?Reviewed expectations re: course of current medical issues. Questions answered. ?Outlined signs and symptoms indicating need for more acute intervention. ?Patient verbalized understanding. ?After Visit Summary given. ? ?SUBJECTIVE: ?History from: patient. ? ?Kylie Nicholson is a 51 y.o. female who presents with complaint of intermittent chest tightness and wheezing. Onset abrupt,  over past 3-4 days . Triggers: unsure. Describes wheezing as moderate when present. Fever: no. Overall normal PO intake without n/v. Sick contacts: no. Ambulatory without difficulty. No LE edema. ?Typically her asthma is well controlled. ?Inhaler use: prn with some relief; needs refill. ?OTC treatment: none. ? ?Social History  ? ?Tobacco Use  ?Smoking Status Every Day  ? Packs/day: 0.50  ? Types: Cigarettes  ?Smokeless Tobacco Never  ? ? ? ?OBJECTIVE: ? ?Vitals:  ? 05/27/21 1838  ?BP: (!) 172/94  ?Pulse: 75  ?Resp: 18   ?Temp: 98.4 ?F (36.9 ?C)  ?TempSrc: Oral  ?SpO2: 96%  ?  ?Elevated BP noted. ? ?General appearance: alert; NAD ?HEENT: [place]; AT; without significant nasal congestion ?Neck: supple without LAD ?Cv: RRR without murmer ?Lungs: unlabored respirations, moderate bilateral expiratory wheezing; cough: mild; no significant respiratory distress ?Skin: warm and dry ?Psychological: alert and cooperative; normal mood and affect ? ?Imaging: ?DG Chest 2 View ? ?Result Date: 05/27/2021 ?CLINICAL DATA:  Increasing shortness of breath over the past 4 days, initial encounter EXAM: CHEST - 2 VIEW COMPARISON:  03/12/2020 FINDINGS: The heart size and mediastinal contours are within normal limits. Both lungs are clear. The visualized skeletal structures are unremarkable. IMPRESSION: No active cardiopulmonary disease. Electronically Signed   By: Alcide Clever M.D.   On: 05/27/2021 18:59  ? ? ?Allergies  ?Allergen Reactions  ? Banana Anaphylaxis and Itching  ? Peach [Prunus Persica] Anaphylaxis and Itching  ? Other   ?  Air Secondary school teacher  ? ? ?Past Medical History:  ?Diagnosis Date  ? Eczema   ? ?Family History  ?Problem Relation Age of Onset  ? Asthma Son   ? Asthma Son   ? Allergic rhinitis Son   ? Asthma Son   ? Angioedema Neg Hx   ? Eczema Neg Hx   ? Urticaria Neg Hx   ? ?Social History  ? ?Socioeconomic History  ? Marital status: Single  ?  Spouse name: Not on file  ?  Number of children: Not on file  ? Years of education: Not on file  ? Highest education level: Not on file  ?Occupational History  ? Not on file  ?Tobacco Use  ? Smoking status: Every Day  ?  Packs/day: 0.50  ?  Types: Cigarettes  ? Smokeless tobacco: Never  ?Vaping Use  ? Vaping Use: Never used  ?Substance and Sexual Activity  ? Alcohol use: Yes  ?  Alcohol/week: 1.0 standard drink  ?  Types: 1 Shots of liquor per week  ?  Comment: occ  ? Drug use: Yes  ?  Types: Marijuana  ? Sexual activity: Yes  ?  Birth control/protection: None  ?Other Topics Concern  ? Not on file  ?Social  History Narrative  ? Not on file  ? ?Social Determinants of Health  ? ?Financial Resource Strain: Not on file  ?Food Insecurity: Not on file  ?Transportation Needs: Not on file  ?Physical Activity: Not on file  ?Stress: Not on file  ?Social Connections: Not on file  ?Intimate Partner Violence: Not on file  ? ? ? ? ? ? ? ? ? ?  ?Mardella Layman, MD ?05/28/21 1002 ? ?

## 2021-06-02 ENCOUNTER — Emergency Department (HOSPITAL_COMMUNITY): Payer: No Typology Code available for payment source

## 2021-06-02 ENCOUNTER — Encounter (HOSPITAL_COMMUNITY): Payer: Self-pay | Admitting: Emergency Medicine

## 2021-06-02 ENCOUNTER — Other Ambulatory Visit: Payer: Self-pay

## 2021-06-02 ENCOUNTER — Emergency Department (HOSPITAL_COMMUNITY)
Admission: EM | Admit: 2021-06-02 | Discharge: 2021-06-03 | Disposition: A | Discharge status: Home / Self Care | Payer: No Typology Code available for payment source | Attending: Emergency Medicine | Admitting: Emergency Medicine

## 2021-06-02 DIAGNOSIS — J45909 Unspecified asthma, uncomplicated: Secondary | ICD-10-CM | POA: Insufficient documentation

## 2021-06-02 DIAGNOSIS — R062 Wheezing: Secondary | ICD-10-CM | POA: Diagnosis present

## 2021-06-02 DIAGNOSIS — Z20822 Contact with and (suspected) exposure to covid-19: Secondary | ICD-10-CM | POA: Insufficient documentation

## 2021-06-02 DIAGNOSIS — J4541 Moderate persistent asthma with (acute) exacerbation: Secondary | ICD-10-CM

## 2021-06-02 LAB — RESP PANEL BY RT-PCR (FLU A&B, COVID) ARPGX2
Influenza A by PCR: NEGATIVE
Influenza B by PCR: NEGATIVE
SARS Coronavirus 2 by RT PCR: NEGATIVE

## 2021-06-02 NOTE — ED Triage Notes (Signed)
Patient reports asthma with SOB , wheezing and dry cough for 2 weeks , denies fever or chills .  ?

## 2021-06-02 NOTE — ED Provider Triage Note (Signed)
Emergency Medicine Provider Triage Evaluation Note ? ?Kylie Nicholson , a 51 y.o. female  was evaluated in triage.  Pt complains of shortness of breath, cough, wheezing x2 weeks.  She states she was seen in urgent care last week and was given a course of prednisone and an albuterol inhaler with little relief.  She feels like her symptoms are getting worse.  She endorses wheezing with a nonproductive cough.  No known measured temperatures.  She does endorse a history of smoking.  No recent travel.  No leg swelling.  No exogenous estrogen use. ? ?Review of Systems  ?Positive: As above ?Negative: As above ? ?Physical Exam  ?BP (!) 165/114 (BP Location: Right Arm)   Pulse 78   Temp 98.4 ?F (36.9 ?C) (Oral)   Resp (!) 22   LMP 05/22/2021   SpO2 99%  ?Gen:   Awake, no distress   ?Resp:  Slightly dyspneic, lung sounds diminished in all lung fields with scattered wheezes throughout ?MSK:   Moves extremities without difficulty  ?Other:   ? ?Medical Decision Making  ?Medically screening exam initiated at 10:13 PM.  Appropriate orders placed.  Kylie Nicholson was informed that the remainder of the evaluation will be completed by another provider, this initial triage assessment does not replace that evaluation, and the importance of remaining in the ED until their evaluation is complete. ? ?COVID test and chest x-ray ordered for further evaluation. ?  ?Kylie Ferrari, PA-C ?06/02/21 2215 ? ?

## 2021-06-03 DIAGNOSIS — J45909 Unspecified asthma, uncomplicated: Secondary | ICD-10-CM | POA: Diagnosis not present

## 2021-06-03 MED ORDER — IPRATROPIUM-ALBUTEROL 0.5-2.5 (3) MG/3ML IN SOLN
3.0000 mL | Freq: Once | RESPIRATORY_TRACT | Status: AC
  Administered 2021-06-03: 3 mL via RESPIRATORY_TRACT
  Filled 2021-06-03: qty 3

## 2021-06-03 MED ORDER — MAGNESIUM SULFATE 2 GM/50ML IV SOLN
2.0000 g | Freq: Once | INTRAVENOUS | Status: AC
  Administered 2021-06-03: 2 g via INTRAVENOUS
  Filled 2021-06-03: qty 50

## 2021-06-03 MED ORDER — PREDNISONE 10 MG PO TABS: 20.0000 mg | ORAL_TABLET | Freq: Two times a day (BID) | ORAL | 0 refills | Status: DC

## 2021-06-03 MED ORDER — ALBUTEROL SULFATE (2.5 MG/3ML) 0.083% IN NEBU: 2.5000 mg | INHALATION_SOLUTION | Freq: Four times a day (QID) | RESPIRATORY_TRACT | 1 refills | Status: DC | PRN

## 2021-06-03 MED ORDER — AZITHROMYCIN 250 MG PO TABS: 250.0000 mg | ORAL_TABLET | Freq: Every day | ORAL | 0 refills | Status: DC

## 2021-06-03 MED ORDER — METHYLPREDNISOLONE SODIUM SUCC 125 MG IJ SOLR
125.0000 mg | Freq: Once | INTRAMUSCULAR | Status: AC
  Administered 2021-06-03: 125 mg via INTRAVENOUS
  Filled 2021-06-03: qty 2

## 2021-06-03 MED ORDER — AZITHROMYCIN 250 MG PO TABS
500.0000 mg | ORAL_TABLET | Freq: Once | ORAL | Status: AC
  Administered 2021-06-03: 500 mg via ORAL
  Filled 2021-06-03: qty 2

## 2021-06-03 NOTE — ED Notes (Signed)
Pt states "that shit aint changed im ready to get the f* out of here I dont need that" when trying to obtain repeat vitals ?

## 2021-06-03 NOTE — ED Provider Notes (Signed)
?[facility] EMERGENCY DEPARTMENT ?Provider Note ? ? ?CSN: [id number] ?Arrival date & time: 06/02/21  2155 ? ?  ? ?History ? ?Chief Complaint  ?Patient presents with  ? Asthma / SOB   ? ? ?Kylie Nicholson is a 51 y.o. female. ? ?Patient is a 51 year old female with past medical history of asthma presenting with complaints of shortness of breath and wheezing.  This has been worsening over the past 2 weeks.  She was seen last week at urgent care and given albuterol and prednisone, but has not had any relief.  She denies any fevers or chills.  She denies any chest pain or leg swelling. ? ?The history is provided by the patient.  ? ?  ? ?Home Medications ?Prior to Admission medications   ?Medication Sig Start Date End Date Taking? Authorizing Provider  ?albuterol (VENTOLIN HFA) 108 (90 Base) MCG/ACT inhaler Inhale 1-2 puffs into the lungs every 6 (six) hours as needed for wheezing or shortness of breath. 05/27/21   Mardella Layman, MD  ?cyclobenzaprine (FLEXERIL) 10 MG tablet Take 1 tablet (10 mg total) by mouth 2 (two) times daily as needed for muscle spasms. 01/25/21   Virgina Norfolk, DO  ?Olopatadine HCl 0.2 % SOLN Apply 1 drop to eye daily. 11/18/20   Mardella Layman, MD  ?predniSONE (DELTASONE) 20 MG tablet Take 2 tablets (40 mg total) by mouth daily. 05/27/21   Mardella Layman, MD  ?triamcinolone (KENALOG) 0.1 % Apply 1 application topically 2 (two) times daily as needed. 06/26/20   Particia Nearing, PA-C  ?fluticasone (FLONASE) 50 MCG/ACT nasal spray Use 1-2 sprays per nostril once daily for nasal congestion ?Patient not taking: Reported on 08/08/2019 06/07/17 03/01/20  Marcelyn Bruins, MD  ?   ? ?Allergies    ?Banana, Peach [prunus persica], and Other   ? ?Review of Systems   ?Review of Systems  ?All other systems reviewed and are negative. ? ?Physical Exam ?Updated Vital Signs ?BP (!) 180/110 (BP Location: Right Arm)   Pulse 68   Temp 98.4 ?F (36.9 ?C) (Oral)   Resp 18   LMP 05/22/2021   SpO2  100%  ?Physical Exam ?Vitals and nursing note reviewed.  ?Constitutional:   ?   General: She is not in acute distress. ?   Appearance: She is well-developed. She is not diaphoretic.  ?HENT:  ?   Head: Normocephalic and atraumatic.  ?Cardiovascular:  ?   Rate and Rhythm: Normal rate and regular rhythm.  ?   Heart sounds: No murmur heard. ?  No friction rub. No gallop.  ?Pulmonary:  ?   Effort: Pulmonary effort is normal. No respiratory distress.  ?   Breath sounds: Wheezing present.  ?Abdominal:  ?   General: Bowel sounds are normal. There is no distension.  ?   Palpations: Abdomen is soft.  ?   Tenderness: There is no abdominal tenderness.  ?Musculoskeletal:     ?   General: Normal range of motion.  ?   Cervical back: Normal range of motion and neck supple.  ?Skin: ?   General: Skin is warm and dry.  ?Neurological:  ?   General: No focal deficit present.  ?   Mental Status: She is alert and oriented to person, place, and time.  ? ? ?ED Results / Procedures / Treatments   ?Labs ?(all labs ordered are listed, but only abnormal results are displayed) ?Labs Reviewed  ?RESP PANEL BY RT-PCR (FLU A&B, COVID) ARPGX2  ? ? ?EKG ?  None ? ?Radiology ?DG Chest 2 View ? ?Result Date: 06/02/2021 ?CLINICAL DATA:  Cough and shortness of breath.  Asthma attack. EXAM: CHEST - 2 VIEW COMPARISON:  05/27/2021 FINDINGS: Mild hyperinflation. Normal heart size and pulmonary vascularity. No focal airspace disease or consolidation in the lungs. No blunting of costophrenic angles. No pneumothorax. Mediastinal contours appear intact. IMPRESSION: No active cardiopulmonary disease. Electronically Signed   By: Burman Nieves M.D.   On: 06/02/2021 22:54   ? ?Procedures ?Procedures  ? ? ?Medications Ordered in ED ?Medications  ?ipratropium-albuterol (DUONEB) 0.5-2.5 (3) MG/3ML nebulizer solution 3 mL (has no administration in time range)  ?ipratropium-albuterol (DUONEB) 0.5-2.5 (3) MG/3ML nebulizer solution 3 mL (has no administration in time range)   ?magnesium sulfate IVPB 2 g 50 mL (has no administration in time range)  ?methylPREDNISolone sodium succinate (SOLU-MEDROL) 125 mg/2 mL injection 125 mg (has no administration in time range)  ?azithromycin (ZITHROMAX) tablet 500 mg (has no administration in time range)  ? ? ?ED Course/ Medical Decision Making/ A&P ?  ?Patient presenting here with complaints of shortness of breath and wheezing.  Patient actively wheezing upon presentation and greatly improved after receiving DuoNeb x2 and Solu-Medrol and magnesium. ? ?Chest x-ray is clear. ? ?I feel as though patient can safely be discharged with prednisone, Zithromax, and follow-up with primary doctor.  I will prescribe a nebulizer and albuterol solution as well as make a referral to pulmonology.                     ? ?Final Clinical Impression(s) / ED Diagnoses ?Final diagnoses:  ?None  ? ? ?Rx / DC Orders ?ED Discharge Orders   ? ? None  ? ?  ? ? ?  ?Geoffery Lyons, MD ?06/03/21 [phone] ? ?

## 2021-06-03 NOTE — Discharge Instructions (Signed)
Begin taking prednisone and Zithromax as prescribed. ? ?Begin using albuterol by nebulizer, every 4 hours as needed for wheezing. ? ?Follow-up with pulmonology in the next week.  The contact information for [facility] pulmonology has been provided in this discharge summary for you to call and make these arrangements. ?

## 2021-11-29 DIAGNOSIS — Z6835 Body mass index (BMI) 35.0-35.9, adult: Secondary | ICD-10-CM | POA: Diagnosis not present

## 2021-11-29 DIAGNOSIS — E669 Obesity, unspecified: Secondary | ICD-10-CM | POA: Diagnosis not present

## 2021-11-29 DIAGNOSIS — G8929 Other chronic pain: Secondary | ICD-10-CM | POA: Diagnosis not present

## 2022-01-30 ENCOUNTER — Ambulatory Visit (HOSPITAL_COMMUNITY)
Admission: EM | Admit: 2022-01-30 | Discharge: 2022-01-30 | Disposition: A | Discharge status: Home / Self Care | Payer: 59 | Attending: Family Medicine | Admitting: Family Medicine

## 2022-01-30 ENCOUNTER — Encounter (HOSPITAL_COMMUNITY): Payer: Self-pay

## 2022-01-30 ENCOUNTER — Other Ambulatory Visit: Payer: Self-pay

## 2022-01-30 DIAGNOSIS — R072 Precordial pain: Secondary | ICD-10-CM | POA: Diagnosis not present

## 2022-01-30 DIAGNOSIS — M25512 Pain in left shoulder: Secondary | ICD-10-CM | POA: Diagnosis not present

## 2022-01-30 DIAGNOSIS — Z6835 Body mass index (BMI) 35.0-35.9, adult: Secondary | ICD-10-CM | POA: Diagnosis not present

## 2022-01-30 MED ORDER — IBUPROFEN 800 MG PO TABS: 800.0000 mg | ORAL_TABLET | Freq: Three times a day (TID) | ORAL | 0 refills | Status: DC | PRN

## 2022-01-30 MED ORDER — TIZANIDINE HCL 4 MG PO TABS: 4.0000 mg | ORAL_TABLET | Freq: Three times a day (TID) | ORAL | 0 refills | Status: DC | PRN

## 2022-01-30 MED ORDER — KETOROLAC TROMETHAMINE 30 MG/ML IJ SOLN
30.0000 mg | Freq: Once | INTRAMUSCULAR | Status: AC
  Administered 2022-01-30: 30 mg via INTRAMUSCULAR

## 2022-01-30 MED ORDER — KETOROLAC TROMETHAMINE 30 MG/ML IJ SOLN
INTRAMUSCULAR | Status: AC
Start: 2022-01-30 — End: ?
  Filled 2022-01-30: qty 1

## 2022-01-30 NOTE — ED Provider Notes (Signed)
[facility]    CSN: [id number] Arrival date & time: 01/30/22  1808      History   Chief Complaint Chief Complaint  Patient presents with   Chest Pain    HPI Kylie Nicholson is a 51 y.o. female.    Chest Pain  Here for left-sided chest pain that began this morning.  She notes little cough but really feels like it is unrelated.  No fever and no congestion.  She does note that the pain in her chest is worse with certain movements of her left arm and she is already been having pain in her neck bilaterally.  No recent trauma or fall.  A 200 mg ibuprofen did help just a tiny bit earlier today.  No vomiting or diarrhea  Last menstrual cycle was November 3  eGFR was nl in 2017 in care everywhere  Past Medical History:  Diagnosis Date   Eczema     There are no problems to display for this patient.   Past Surgical History:  Procedure Laterality Date   KNEE SURGERY      OB History   No obstetric history on file.      Home Medications    Prior to Admission medications   Medication Sig Start Date End Date Taking? Authorizing Provider  ibuprofen (ADVIL) 800 MG tablet Take 1 tablet (800 mg total) by mouth every 8 (eight) hours as needed (pain). 01/30/22  Yes Judd Mccubbin, Gwenlyn Perking, MD  tiZANidine (ZANAFLEX) 4 MG tablet Take 1 tablet (4 mg total) by mouth every 8 (eight) hours as needed for muscle spasms. 01/30/22  Yes Barrett Henle, MD  fluticasone Asencion Islam) 50 MCG/ACT nasal spray Use 1-2 sprays per nostril once daily for nasal congestion Patient not taking: Reported on 08/08/2019 06/07/17 03/01/20  Kennith Gain, MD    Family History Family History  Problem Relation Age of Onset   Asthma Son    Asthma Son    Allergic rhinitis Son    Asthma Son    Angioedema Neg Hx    Eczema Neg Hx    Urticaria Neg Hx     Social History Social History   Tobacco Use   Smoking status: Every Day    Packs/day: 0.25    Types: Cigarettes   Smokeless  tobacco: Never  Vaping Use   Vaping Use: Never used  Substance Use Topics   Alcohol use: Yes    Alcohol/week: 1.0 standard drink of alcohol    Types: 1 Shots of liquor per week    Comment: occ   Drug use: Yes    Types: Marijuana     Allergies   Banana, Peach [prunus persica], and Other   Review of Systems Review of Systems  Cardiovascular:  Positive for chest pain.     Physical Exam Triage Vital Signs ED Triage Vitals  Enc Vitals Group     BP 01/30/22 1823 (!) 144/103     Pulse Rate 01/30/22 1823 (!) 56     Resp 01/30/22 1823 20     Temp 01/30/22 1823 (!) 97.5 F (36.4 C)     Temp Source 01/30/22 1823 Oral     SpO2 01/30/22 1823 100 %     Weight --      Height --      Head Circumference --      Peak Flow --      Pain Score 01/30/22 1819 7     Pain Loc --  Pain Edu? --      Excl. in [place]? --    No data found.  Updated Vital Signs BP (!) 144/103 (BP Location: Right Arm)   Pulse (!) 56   Temp (!) 97.5 F (36.4 C) (Oral)   Resp 20   LMP 01/23/2022 (Exact Date)   SpO2 100%   Visual Acuity Right Eye Distance:   Left Eye Distance:   Bilateral Distance:    Right Eye Near:   Left Eye Near:    Bilateral Near:     Physical Exam Vitals reviewed.  Constitutional:      General: She is not in acute distress.    Appearance: She is not ill-appearing, toxic-appearing or diaphoretic.  HENT:     Mouth/Throat:     Mouth: Mucous membranes are moist.  Eyes:     Extraocular Movements: Extraocular movements intact.     Pupils: Pupils are equal, round, and reactive to light.  Cardiovascular:     Rate and Rhythm: Normal rate and regular rhythm.     Heart sounds: No murmur heard. Pulmonary:     Breath sounds: No stridor. No wheezing, rhonchi or rales.  Chest:     Chest wall: Tenderness (Left anterior chest at about the second intercostal space.) present.  Skin:    Coloration: Skin is not jaundiced or pale.  Neurological:     Mental Status: She is alert and  oriented to person, place, and time.  Psychiatric:        Behavior: Behavior normal.      UC Treatments / Results  Labs (all labs ordered are listed, but only abnormal results are displayed) Labs Reviewed - No data to display  EKG   Radiology No results found.  Procedures Procedures (including critical care time)  Medications Ordered in UC Medications  ketorolac (TORADOL) 30 MG/ML injection 30 mg (has no administration in time range)    Initial Impression / Assessment and Plan / UC Course  I have reviewed the triage vital signs and the nursing notes.  Pertinent labs & imaging results that were available during my care of the patient were reviewed by me and considered in my medical decision making (see chart for details).       EKG is normal without ST changes, except there is sinus bradycardia With her chest tenderness and that its exacerbated by movement, I am pretty confident this is originating in her chest wall and may have radiated from her neck pain that she has been having.  Anti-inflammatories and muscle relaxer sent in.  She will follow-up with her primary care Final Clinical Impressions(s) / UC Diagnoses   Final diagnoses:  Precordial pain     Discharge Instructions      Your EKG did not show any signs of heart problem  You have been given a shot of Toradol 30 mg today.  Take ibuprofen 800 mg--1 tab every 8 hours as needed for pain.   Take tizanidine 4 mg--1 every 8 hours as needed for muscle spasms  Please follow-up with your primary care about the neck and chest pain     ED Prescriptions     Medication Sig Dispense Auth. Provider   ibuprofen (ADVIL) 800 MG tablet Take 1 tablet (800 mg total) by mouth every 8 (eight) hours as needed (pain). 21 tablet Brylee Berk, Gwenlyn Perking, MD   tiZANidine (ZANAFLEX) 4 MG tablet Take 1 tablet (4 mg total) by mouth every 8 (eight) hours as needed for muscle spasms. 30 tablet  Barrett Henle, MD      PDMP  not reviewed this encounter.   Barrett Henle, MD 01/30/22 [phone]

## 2022-01-30 NOTE — Discharge Instructions (Signed)
Your EKG did not show any signs of heart problem  You have been given a shot of Toradol 30 mg today.  Take ibuprofen 800 mg--1 tab every 8 hours as needed for pain.   Take tizanidine 4 mg--1 every 8 hours as needed for muscle spasms  Please follow-up with your primary care about the neck and chest pain

## 2022-01-30 NOTE — ED Triage Notes (Signed)
Patient states she woke up this morning with left sided chest pressure radiating into her shoulders. States she just feels weak in her upper body and neck.

## 2022-02-20 DIAGNOSIS — Z419 Encounter for procedure for purposes other than remedying health state, unspecified: Secondary | ICD-10-CM | POA: Diagnosis not present

## 2022-03-02 DIAGNOSIS — J398 Other specified diseases of upper respiratory tract: Secondary | ICD-10-CM | POA: Diagnosis not present

## 2022-03-02 DIAGNOSIS — M791 Myalgia, unspecified site: Secondary | ICD-10-CM | POA: Diagnosis not present

## 2022-03-14 ENCOUNTER — Encounter (HOSPITAL_COMMUNITY): Payer: Self-pay

## 2022-03-14 ENCOUNTER — Emergency Department (HOSPITAL_COMMUNITY)
Admission: EM | Admit: 2022-03-14 | Discharge: 2022-03-14 | Disposition: A | Discharge status: Home / Self Care | Payer: 59 | Attending: Emergency Medicine | Admitting: Emergency Medicine

## 2022-03-14 ENCOUNTER — Other Ambulatory Visit: Payer: Self-pay

## 2022-03-14 DIAGNOSIS — Y92410 Unspecified street and highway as the place of occurrence of the external cause: Secondary | ICD-10-CM | POA: Diagnosis not present

## 2022-03-14 DIAGNOSIS — S39012A Strain of muscle, fascia and tendon of lower back, initial encounter: Secondary | ICD-10-CM | POA: Insufficient documentation

## 2022-03-14 DIAGNOSIS — S3992XA Unspecified injury of lower back, initial encounter: Secondary | ICD-10-CM | POA: Diagnosis not present

## 2022-03-14 LAB — PREGNANCY, URINE: Preg Test, Ur: NEGATIVE

## 2022-03-14 MED ORDER — METHOCARBAMOL 500 MG PO TABS: 500.0000 mg | ORAL_TABLET | Freq: Two times a day (BID) | ORAL | 0 refills | Status: DC

## 2022-03-14 MED ORDER — LIDOCAINE 5 % EX PTCH
2.0000 | MEDICATED_PATCH | Freq: Every day | CUTANEOUS | Status: DC
  Administered 2022-03-14: 2 via TRANSDERMAL
  Filled 2022-03-14: qty 2

## 2022-03-14 NOTE — Discharge Instructions (Signed)
You were seen in the emergency department today for your lower back.  Your physical exam and vital signs are very reassuring.  The muscles in your lower back and shoulders are in what is called spasm, meaning they are inappropriately tightened up.  This can be quite painful.  To help with your pain you may take Tylenol and / or NSAID medication (such as ibuprofen or naproxen) to help with your pain.  Additionally, you have been prescribed a muscle relaxer called Robaxin to help relieve some of the muscle spasm.  Please be advised that this medication may make you very sleepy, so you should not drive or operate heavy machinery while you are taking it.  You may also utilize topical pain relief such as Biofreeze, IcyHot, or topical lidocaine patches.  I also recommend that you apply heat to the area, such as a hot shower or heating pad, and follow heat application with massage of the muscles that are most tight.  Please return to the emergency department if you develop any numbness/tingling/weakness in your arms or legs, any difficulty urinating, or urinary incontinence chest pain, shortness of breath, abdominal pain, nausea or vomiting that does not stop, or any other new severe symptoms.
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
## 2022-03-17 ENCOUNTER — Telehealth: Payer: Self-pay

## 2022-03-17 NOTE — Patient Outreach (Signed)
Transition Care Management Unsuccessful Follow-up Telephone Call  Date of discharge and from where:  03/14/22 [facility]  Attempts:  1st Attempt  Reason for unsuccessful TCM follow-up call:  Left voice message  Gus Puma, BSW, [facility]  [place]  High Risk Managed Medicaid Team  [phone]

## 2022-03-23 ENCOUNTER — Emergency Department (HOSPITAL_COMMUNITY): Payer: 59

## 2022-03-23 ENCOUNTER — Encounter (HOSPITAL_COMMUNITY): Payer: Self-pay

## 2022-03-23 ENCOUNTER — Emergency Department (HOSPITAL_COMMUNITY)
Admission: EM | Admit: 2022-03-23 | Discharge: 2022-03-24 | Disposition: A | Discharge status: Home / Self Care | Payer: 59 | Attending: Emergency Medicine | Admitting: Emergency Medicine

## 2022-03-23 DIAGNOSIS — M4316 Spondylolisthesis, lumbar region: Secondary | ICD-10-CM | POA: Insufficient documentation

## 2022-03-23 DIAGNOSIS — N750 Cyst of Bartholin's gland: Secondary | ICD-10-CM | POA: Insufficient documentation

## 2022-03-23 DIAGNOSIS — M545 Low back pain, unspecified: Secondary | ICD-10-CM | POA: Insufficient documentation

## 2022-03-23 DIAGNOSIS — Z041 Encounter for examination and observation following transport accident: Secondary | ICD-10-CM | POA: Diagnosis not present

## 2022-03-23 LAB — POC URINE PREG, ED: Preg Test, Ur: NEGATIVE
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
## 2022-03-24 ENCOUNTER — Emergency Department (HOSPITAL_COMMUNITY): Payer: 59

## 2022-03-24 DIAGNOSIS — M545 Low back pain, unspecified: Secondary | ICD-10-CM | POA: Diagnosis not present

## 2022-03-24 DIAGNOSIS — M4316 Spondylolisthesis, lumbar region: Secondary | ICD-10-CM | POA: Diagnosis not present

## 2022-03-24 MED ORDER — OXYCODONE-ACETAMINOPHEN 5-325 MG PO TABS: 1.0000 | ORAL_TABLET | Freq: Four times a day (QID) | ORAL | 0 refills | Status: DC | PRN

## 2022-03-24 MED ORDER — KETOROLAC TROMETHAMINE 15 MG/ML IJ SOLN
30.0000 mg | Freq: Once | INTRAMUSCULAR | Status: AC
  Administered 2022-03-24: 30 mg via INTRAMUSCULAR
  Filled 2022-03-24: qty 2
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
## 2022-03-25 ENCOUNTER — Telehealth: Payer: Self-pay

## 2022-03-25 NOTE — Patient Outreach (Signed)
Transition Care Management Unsuccessful Follow-up Telephone Call  Date of discharge and from where:  03/24/22 [facility]yl[facility]  Attempts:  1st Attempt  Reason for unsuccessful TCM follow-up call:  Left voice message

## 2022-08-03 DIAGNOSIS — Z720 Tobacco use: Secondary | ICD-10-CM | POA: Diagnosis not present

## 2022-08-03 DIAGNOSIS — Z3009 Encounter for other general counseling and advice on contraception: Secondary | ICD-10-CM | POA: Diagnosis not present

## 2022-08-03 DIAGNOSIS — D500 Iron deficiency anemia secondary to blood loss (chronic): Secondary | ICD-10-CM | POA: Diagnosis not present

## 2022-08-03 DIAGNOSIS — Z1330 Encounter for screening examination for mental health and behavioral disorders, unspecified: Secondary | ICD-10-CM | POA: Diagnosis not present

## 2022-08-03 DIAGNOSIS — I10 Essential (primary) hypertension: Secondary | ICD-10-CM | POA: Diagnosis not present

## 2022-08-03 DIAGNOSIS — R9431 Abnormal electrocardiogram [ECG] [EKG]: Secondary | ICD-10-CM | POA: Diagnosis not present

## 2022-08-03 DIAGNOSIS — R519 Headache, unspecified: Secondary | ICD-10-CM | POA: Diagnosis not present

## 2022-08-03 DIAGNOSIS — Z6836 Body mass index (BMI) 36.0-36.9, adult: Secondary | ICD-10-CM | POA: Diagnosis not present

## 2022-08-03 DIAGNOSIS — R29898 Other symptoms and signs involving the musculoskeletal system: Secondary | ICD-10-CM | POA: Diagnosis not present

## 2022-08-14 DIAGNOSIS — M4722 Other spondylosis with radiculopathy, cervical region: Secondary | ICD-10-CM | POA: Diagnosis not present

## 2022-08-20 DIAGNOSIS — R0609 Other forms of dyspnea: Secondary | ICD-10-CM | POA: Diagnosis not present

## 2022-08-20 DIAGNOSIS — I10 Essential (primary) hypertension: Secondary | ICD-10-CM | POA: Diagnosis not present

## 2022-09-16 DIAGNOSIS — M5412 Radiculopathy, cervical region: Secondary | ICD-10-CM | POA: Diagnosis not present

## 2022-09-16 DIAGNOSIS — H1010 Acute atopic conjunctivitis, unspecified eye: Secondary | ICD-10-CM | POA: Diagnosis not present

## 2022-09-16 DIAGNOSIS — J309 Allergic rhinitis, unspecified: Secondary | ICD-10-CM | POA: Diagnosis not present

## 2022-09-16 DIAGNOSIS — M722 Plantar fascial fibromatosis: Secondary | ICD-10-CM | POA: Diagnosis not present

## 2022-09-16 DIAGNOSIS — I10 Essential (primary) hypertension: Secondary | ICD-10-CM | POA: Diagnosis not present

## 2023-03-30 ENCOUNTER — Ambulatory Visit (HOSPITAL_COMMUNITY)
Admission: EM | Admit: 2023-03-30 | Discharge: 2023-03-30 | Disposition: A | Discharge status: Home / Self Care | Payer: 59 | Attending: Physician Assistant | Admitting: Physician Assistant

## 2023-03-30 ENCOUNTER — Encounter (HOSPITAL_COMMUNITY): Payer: Self-pay

## 2023-03-30 DIAGNOSIS — R062 Wheezing: Secondary | ICD-10-CM | POA: Diagnosis not present

## 2023-03-30 DIAGNOSIS — J069 Acute upper respiratory infection, unspecified: Secondary | ICD-10-CM | POA: Diagnosis not present

## 2023-03-30 DIAGNOSIS — J9801 Acute bronchospasm: Secondary | ICD-10-CM | POA: Insufficient documentation

## 2023-03-30 LAB — POCT INFLUENZA A/B
Influenza A, POC: NEGATIVE
Influenza B, POC: NEGATIVE

## 2023-03-30 MED ORDER — IPRATROPIUM-ALBUTEROL 0.5-2.5 (3) MG/3ML IN SOLN
3.0000 mL | Freq: Once | RESPIRATORY_TRACT | Status: AC
  Administered 2023-03-30: 3 mL via RESPIRATORY_TRACT

## 2023-03-30 MED ORDER — PROMETHAZINE-DM 6.25-15 MG/5ML PO SYRP: 5.0000 mL | ORAL_SOLUTION | Freq: Two times a day (BID) | ORAL | 0 refills | Status: DC | PRN

## 2023-03-30 MED ORDER — METHYLPREDNISOLONE SODIUM SUCC 125 MG IJ SOLR
80.0000 mg | Freq: Once | INTRAMUSCULAR | Status: AC
  Administered 2023-03-30: 80 mg via INTRAMUSCULAR

## 2023-03-30 MED ORDER — BUDESONIDE-FORMOTEROL FUMARATE 160-4.5 MCG/ACT IN AERO: 2.0000 | INHALATION_SPRAY | Freq: Two times a day (BID) | RESPIRATORY_TRACT | 0 refills | Status: AC

## 2023-03-30 MED ORDER — IPRATROPIUM-ALBUTEROL 0.5-2.5 (3) MG/3ML IN SOLN
RESPIRATORY_TRACT | Status: AC
  Filled 2023-03-30: qty 3

## 2023-03-30 MED ORDER — PREDNISONE 10 MG (21) PO TBPK: ORAL_TABLET | ORAL | 0 refills | Status: DC

## 2023-03-30 MED ORDER — ALBUTEROL SULFATE HFA 108 (90 BASE) MCG/ACT IN AERS: 1.0000 | INHALATION_SPRAY | Freq: Four times a day (QID) | RESPIRATORY_TRACT | 0 refills | Status: AC | PRN

## 2023-03-30 MED ORDER — METHYLPREDNISOLONE SODIUM SUCC 125 MG IJ SOLR
INTRAMUSCULAR | Status: AC
  Filled 2023-03-30: qty 2

## 2023-03-30 NOTE — ED Provider Notes (Signed)
 [facility]    CSN: [id number] Arrival date & time: 03/30/23  1840      History   Chief Complaint Chief Complaint  Patient presents with   Cough   Shortness of Breath    HPI Kylie Nicholson is a 53 y.o. female.   Patient presents today with a several day history of worsening cough, chest tightness, wheezing, shortness of breath.  She denies any fever, congestion, sore throat, nausea, vomiting.  Denies any known sick contacts.  She has not tried any over-the-counter medication for symptom management.  She denies formal diagnosis of asthma or COPD but reports that she has had similar symptoms in the past that have been treated with albuterol .  She does not currently have a prescription for this and so has not tried it.  She does smoke occasionally.  She denies any recent antibiotics or steroids.  Denies history of diabetes.  She is confident that she is not pregnant.  She is having difficulty sleeping at night as a result of the cough.  The cough is worse with any talking or activity.    Past Medical History:  Diagnosis Date   Eczema     There are no active problems to display for this patient.   Past Surgical History:  Procedure Laterality Date   KNEE SURGERY      OB History   No obstetric history on file.      Home Medications    Prior to Admission medications   Medication Sig Start Date End Date Taking? Authorizing Provider  albuterol  (VENTOLIN  HFA) 108 (90 Base) MCG/ACT inhaler Inhale 1-2 puffs into the lungs every 6 (six) hours as needed for wheezing or shortness of breath. 03/30/23  Yes Purvi Ruehl K, PA-C  budesonide -formoterol  (SYMBICORT ) 160-4.5 MCG/ACT inhaler Inhale 2 puffs into the lungs in the morning and at bedtime. 03/30/23  Yes Lokelani Lutes, Rocky POUR, PA-C  predniSONE  (STERAPRED UNI-PAK 21 TAB) 10 MG (21) TBPK tablet As directed 03/30/23  Yes Alexander Aument K, PA-C  promethazine -dextromethorphan (PROMETHAZINE -DM) 6.25-15 MG/5ML syrup Take 5 mLs by mouth 2  (two) times daily as needed for cough. 03/30/23  Yes Kanen Mottola K, PA-C  fluticasone  (FLONASE ) 50 MCG/ACT nasal spray Use 1-2 sprays per nostril once daily for nasal congestion Patient not taking: Reported on 08/08/2019 06/07/17 03/01/20  Jeneal Danita Macintosh, MD    Family History Family History  Problem Relation Age of Onset   Asthma Son    Asthma Son    Allergic rhinitis Son    Asthma Son    Angioedema Neg Hx    Eczema Neg Hx    Urticaria Neg Hx     Social History Social History   Tobacco Use   Smoking status: Some Days    Current packs/day: 0.25    Types: Cigarettes   Smokeless tobacco: Never  Vaping Use   Vaping status: Never Used  Substance Use Topics   Alcohol use: Yes    Alcohol/week: 1.0 standard drink of alcohol    Types: 1 Shots of liquor per week    Comment: occ   Drug use: Yes    Types: Marijuana     Allergies   Banana, Nicholson [prunus persica], and Other   Review of Systems Review of Systems  Constitutional:  Positive for activity change. Negative for appetite change, fatigue and fever.  HENT:  Negative for congestion and sore throat.   Respiratory:  Positive for cough, chest tightness, shortness of breath and wheezing.  Cardiovascular:  Negative for chest pain.  Gastrointestinal:  Negative for abdominal pain, diarrhea, nausea and vomiting.  Musculoskeletal:  Negative for arthralgias and myalgias.  Neurological:  Negative for dizziness, light-headedness and headaches.     Physical Exam Triage Vital Signs ED Triage Vitals  Encounter Vitals Group     BP 03/30/23 2017 135/84     Systolic BP Percentile --      Diastolic BP Percentile --      Pulse Rate 03/30/23 2017 87     Resp 03/30/23 2017 20     Temp 03/30/23 2017 99.5 F (37.5 C)     Temp Source 03/30/23 2017 Oral     SpO2 03/30/23 2017 96 %     Weight --      Height --      Head Circumference --      Peak Flow --      Pain Score 03/30/23 2019 0     Pain Loc --      Pain Education  --      Exclude from Growth Chart --    No data found.  Updated Vital Signs BP 135/84 (BP Location: Left Arm)   Pulse 87   Temp 99.5 F (37.5 C) (Oral)   Resp 20   LMP 03/16/2023 (Approximate)   SpO2 96%   Visual Acuity Right Eye Distance:   Left Eye Distance:   Bilateral Distance:    Right Eye Near:   Left Eye Near:    Bilateral Near:     Physical Exam Vitals reviewed.  Constitutional:      General: She is awake. She is not in acute distress.    Appearance: Normal appearance. She is well-developed. She is not ill-appearing.     Comments: Very pleasant female appears stated age in no acute distress sitting comfortably in exam room  HENT:     Head: Normocephalic and atraumatic.     Right Ear: Tympanic membrane, ear canal and external ear normal. Tympanic membrane is not erythematous or bulging.     Left Ear: Tympanic membrane, ear canal and external ear normal. Tympanic membrane is not erythematous or bulging.     Nose:     Right Sinus: No maxillary sinus tenderness or frontal sinus tenderness.     Left Sinus: No maxillary sinus tenderness or frontal sinus tenderness.     Mouth/Throat:     Pharynx: Uvula midline. No oropharyngeal exudate or posterior oropharyngeal erythema.  Cardiovascular:     Rate and Rhythm: Normal rate and regular rhythm.     Heart sounds: Normal heart sounds, S1 normal and S2 normal. No murmur heard. Pulmonary:     Effort: Pulmonary effort is normal.     Breath sounds: Wheezing present. No rhonchi or rales.     Comments: Widespread wheezing Psychiatric:        Behavior: Behavior is cooperative.      UC Treatments / Results  Labs (all labs ordered are listed, but only abnormal results are displayed) Labs Reviewed  SARS CORONAVIRUS 2 (TAT 6-24 HRS)  POCT INFLUENZA A/B    EKG   Radiology No results found.  Procedures Procedures (including critical care time)  Medications Ordered in UC Medications  ipratropium-albuterol  (DUONEB)  0.5-2.5 (3) MG/3ML nebulizer solution 3 mL (3 mLs Nebulization Given 03/30/23 2056)  methylPREDNISolone  sodium succinate (SOLU-MEDROL ) 125 mg/2 mL injection 80 mg (80 mg Intramuscular Given 03/30/23 2054)    Initial Impression / Assessment and Plan / UC Course  I  have reviewed the triage vital signs and the nursing notes.  Pertinent labs & imaging results that were available during my care of the patient were reviewed by me and considered in my medical decision making (see chart for details).     Patient is well-appearing, afebrile, nontoxic, nontachycardic.  She had significant wheezing that did improve following DuoNeb and Solu-Medrol  in clinic.  Flu testing was negative.  COVID test is pending.  No evidence of acute infection on physical exam that would warrant initiation of antibiotics.  We discussed likely viral etiology.  She denies formal diagnosis of asthma but given her recurrent symptoms we will treat for asthma exacerbation.  She was given albuterol  to be used as needed and will start Symbicort  twice daily.  Discussed that she is to rinse her mouth following use of this medication to prevent thrush.  Will start prednisone  tomorrow (03/31/2023) and we discussed that she is also take NSAIDs with this medication.  She can use Tylenol , Mucinex, Flonase , nasal saline/sinus rinses for additional symptom relief.  She was given Promethazine  DM for cough.  We discussed that this can be sedating and she is not to drive or drink alcohol with taking it.  If her symptoms are not improving within a few days she is to return for reevaluation.  If anything worsens and she has worsening cough, shortness of breath, chest pain, fever, nausea, vomiting she needs to be seen emergently.  Strict return precautions given.  Excuse provided.  Final Clinical Impressions(s) / UC Diagnoses   Final diagnoses:  Viral URI with cough  Wheezing  Bronchospasm     Discharge Instructions      You tested negative for flu.   We will contact you if you are positive for COVID.  I suspect you have a virus that has caused irritation in your lungs.  Use albuterol  every 4-6 hours as needed.  Start Symbicort  twice daily.  Rinse your mouth following use of this medication to prevent thrush.  Start prednisone  tomorrow (03/31/2023).  Do not take NSAIDs with this medication including aspirin, ibuprofen /Advil , naproxen /Aleve .  Use Tylenol , Mucinex, Flonase , nasal saline/sinus rinses for symptom relief.  Take Promethazine  DM for cough.  This will make you sleepy so do not drive or drink alcohol while taking it.  If your symptoms are not improving within a few days please return for reevaluation.  If anything worsens and you have high fever, worsening cough, shortness of breath despite this medication, nausea, vomiting you need to be seen immediately.     ED Prescriptions     Medication Sig Dispense Auth. Provider   albuterol  (VENTOLIN  HFA) 108 (90 Base) MCG/ACT inhaler Inhale 1-2 puffs into the lungs every 6 (six) hours as needed for wheezing or shortness of breath. 18 g Caldwell Kronenberger K, PA-C   budesonide -formoterol  (SYMBICORT ) 160-4.5 MCG/ACT inhaler Inhale 2 puffs into the lungs in the morning and at bedtime. 1 each Tashia Leiterman K, PA-C   promethazine -dextromethorphan (PROMETHAZINE -DM) 6.25-15 MG/5ML syrup Take 5 mLs by mouth 2 (two) times daily as needed for cough. 118 mL Megon Kalina K, PA-C   predniSONE  (STERAPRED UNI-PAK 21 TAB) 10 MG (21) TBPK tablet As directed 21 tablet Rosendo Couser K, PA-C      PDMP not reviewed this encounter.   Sherrell Rocky POUR, PA-C 03/30/23 2120

## 2023-03-30 NOTE — Discharge Instructions (Signed)
 You tested negative for flu.  We will contact you if you are positive for COVID.  I suspect you have a virus that has caused irritation in your lungs.  Use albuterol  every 4-6 hours as needed.  Start Symbicort  twice daily.  Rinse your mouth following use of this medication to prevent thrush.  Start prednisone  tomorrow (03/31/2023).  Do not take NSAIDs with this medication including aspirin, ibuprofen /Advil , naproxen /Aleve .  Use Tylenol , Mucinex, Flonase , nasal saline/sinus rinses for symptom relief.  Take Promethazine  DM for cough.  This will make you sleepy so do not drive or drink alcohol while taking it.  If your symptoms are not improving within a few days please return for reevaluation.  If anything worsens and you have high fever, worsening cough, shortness of breath despite this medication, nausea, vomiting you need to be seen immediately.

## 2023-03-30 NOTE — ED Triage Notes (Signed)
 Patient c/o SOB and a non productive cough since yesterday.   Patient states she has not taken any medications for her symptoms.

## 2023-03-31 LAB — SARS CORONAVIRUS 2 (TAT 6-24 HRS): SARS Coronavirus 2: NEGATIVE

## 2023-08-13 ENCOUNTER — Encounter (HOSPITAL_COMMUNITY): Payer: Self-pay

## 2023-08-13 ENCOUNTER — Ambulatory Visit (HOSPITAL_COMMUNITY)
Admission: EM | Admit: 2023-08-13 | Discharge: 2023-08-13 | Disposition: A | Discharge status: Home / Self Care | Payer: Self-pay

## 2023-08-13 ENCOUNTER — Ambulatory Visit (INDEPENDENT_AMBULATORY_CARE_PROVIDER_SITE_OTHER): Payer: Self-pay

## 2023-08-13 DIAGNOSIS — S93602A Unspecified sprain of left foot, initial encounter: Secondary | ICD-10-CM

## 2023-08-13 MED ORDER — DICLOFENAC SODIUM 50 MG PO TBEC: 50.0000 mg | DELAYED_RELEASE_TABLET | Freq: Two times a day (BID) | ORAL | 1 refills | Status: AC

## 2023-08-13 NOTE — ED Provider Notes (Signed)
 [facility]  Note:  This document was prepared using Dragon voice recognition software and may include unintentional dictation errors.  MRN: [id number] DOB: 1970/04/10  Subjective:   Kylie Nicholson is a 53 y.o. female presenting for left foot pain and swelling x 1 week.  Patient reports she does not remember any specific injury or trauma.  Patient reports that she believes it is due to new pair of shoes that she has been wearing.  Has difficulty with walking, mild limp.  Patient has been using ibuprofen  600 to 800 mg as needed for pain with minimal improvement.  Patient denies any previous trauma or severe injury to the foot.  No current facility-administered medications for this encounter.  Current Outpatient Medications:    diclofenac (VOLTAREN) 50 MG EC tablet, Take 1 tablet (50 mg total) by mouth 2 (two) times daily., Disp: 30 tablet, Rfl: 1   albuterol  (VENTOLIN  HFA) 108 (90 Base) MCG/ACT inhaler, Inhale 1-2 puffs into the lungs every 6 (six) hours as needed for wheezing or shortness of breath., Disp: 18 g, Rfl: 0   budesonide -formoterol  (SYMBICORT ) 160-4.5 MCG/ACT inhaler, Inhale 2 puffs into the lungs in the morning and at bedtime., Disp: 1 each, Rfl: 0   Allergies  Allergen Reactions   Banana Anaphylaxis and Itching   Peach [Prunus Persica] Anaphylaxis and Itching   Other     Air Kelle Pate    Past Medical History:  Diagnosis Date   Eczema      Past Surgical History:  Procedure Laterality Date   KNEE SURGERY      Family History  Problem Relation Age of Onset   Asthma Son    Asthma Son    Allergic rhinitis Son    Asthma Son    Angioedema Neg Hx    Eczema Neg Hx    Urticaria Neg Hx     Social History   Tobacco Use   Smoking status: Some Days    Current packs/day: 0.25    Types: Cigarettes   Smokeless tobacco: Never  Vaping Use   Vaping status: Never Used  Substance Use Topics   Alcohol use: Yes    Alcohol/week: 1.0 standard drink of alcohol     Types: 1 Shots of liquor per week    Comment: occ   Drug use: Yes    Types: Marijuana    ROS Refer to HPI for ROS details.  Objective:   Vitals: BP (!) 155/89 (BP Location: Right Arm)   Pulse 68   Temp 98.3 F (36.8 C) (Oral)   Resp 16   LMP 07/06/2023 (Approximate)   SpO2 98%   Physical Exam Vitals and nursing note reviewed.  Constitutional:      General: She is not in acute distress.    Appearance: She is well-developed. She is not ill-appearing or toxic-appearing.  HENT:     Head: Normocephalic and atraumatic.  Cardiovascular:     Rate and Rhythm: Normal rate.  Pulmonary:     Effort: Pulmonary effort is normal. No respiratory distress.  Musculoskeletal:     Left foot: Decreased range of motion. Normal capillary refill. Tenderness and bony tenderness present. No swelling or deformity. Normal pulse.  Skin:    General: Skin is warm and dry.  Neurological:     General: No focal deficit present.     Mental Status: She is alert and oriented to person, place, and time.  Psychiatric:        Mood and Affect: Mood normal.  Behavior: Behavior normal.     Procedures  No results found for this or any previous visit (from the past 24 hours).  No results found.   Assessment and Plan :     Discharge Instructions       1. Sprain of left foot, initial encounter (Primary) - DG Foot Complete Left x-ray completed in UC shows no acute fracture to the left foot, pain is most likely soft tissue in nature - diclofenac (VOLTAREN) 50 MG EC tablet; Take 1 tablet (50 mg total) by mouth 2 (two) times daily for left foot pain - Apply ace wrap for compression and protection until follow-up with orthopedics - Apply Post op shoe for protection and immobilization until follow-up with orthopedics - AMB referral to sports medicine for follow-up evaluation of left foot pain.    Margues Filippini B Adelayde Minney   Gerline Ratto, [place], [place] 08/13/23 2010

## 2023-08-13 NOTE — Discharge Instructions (Addendum)
  1. Sprain of left foot, initial encounter (Primary) - DG Foot Complete Left x-ray completed in UC shows no acute fracture to the left foot, pain is most likely soft tissue in nature - diclofenac (VOLTAREN) 50 MG EC tablet; Take 1 tablet (50 mg total) by mouth 2 (two) times daily for left foot pain - Apply ace wrap for compression and protection until follow-up with orthopedics - Apply Post op shoe for protection and immobilization until follow-up with orthopedics - AMB referral to sports medicine for follow-up evaluation of left foot pain.

## 2023-08-13 NOTE — ED Triage Notes (Signed)
 Patient presenting with left foot pain and swelling onset 1 week ago. No known falls or injuries, thinks it may have to do with her current shoes. Patient walking with a visible limp. When lifting the foot there is "excruciating" pain so Patient will drag the foot. Pain in the top of the foot when moving the pinky toe.   Prescriptions or OTC medications tried: Yes- Ibuprofen    with mild relief

## 2023-08-17 ENCOUNTER — Ambulatory Visit (HOSPITAL_COMMUNITY): Payer: Self-pay

## 2024-01-04 IMAGING — DX DG CHEST 2V
2 series · 2 of 2 positions shown · non-contrast
Comparison: 03/12/2020

CLINICAL DATA: Increasing shortness of breath over the past 4 days,
initial encounter

EXAM:
CHEST - 2 VIEW

[chest pa]
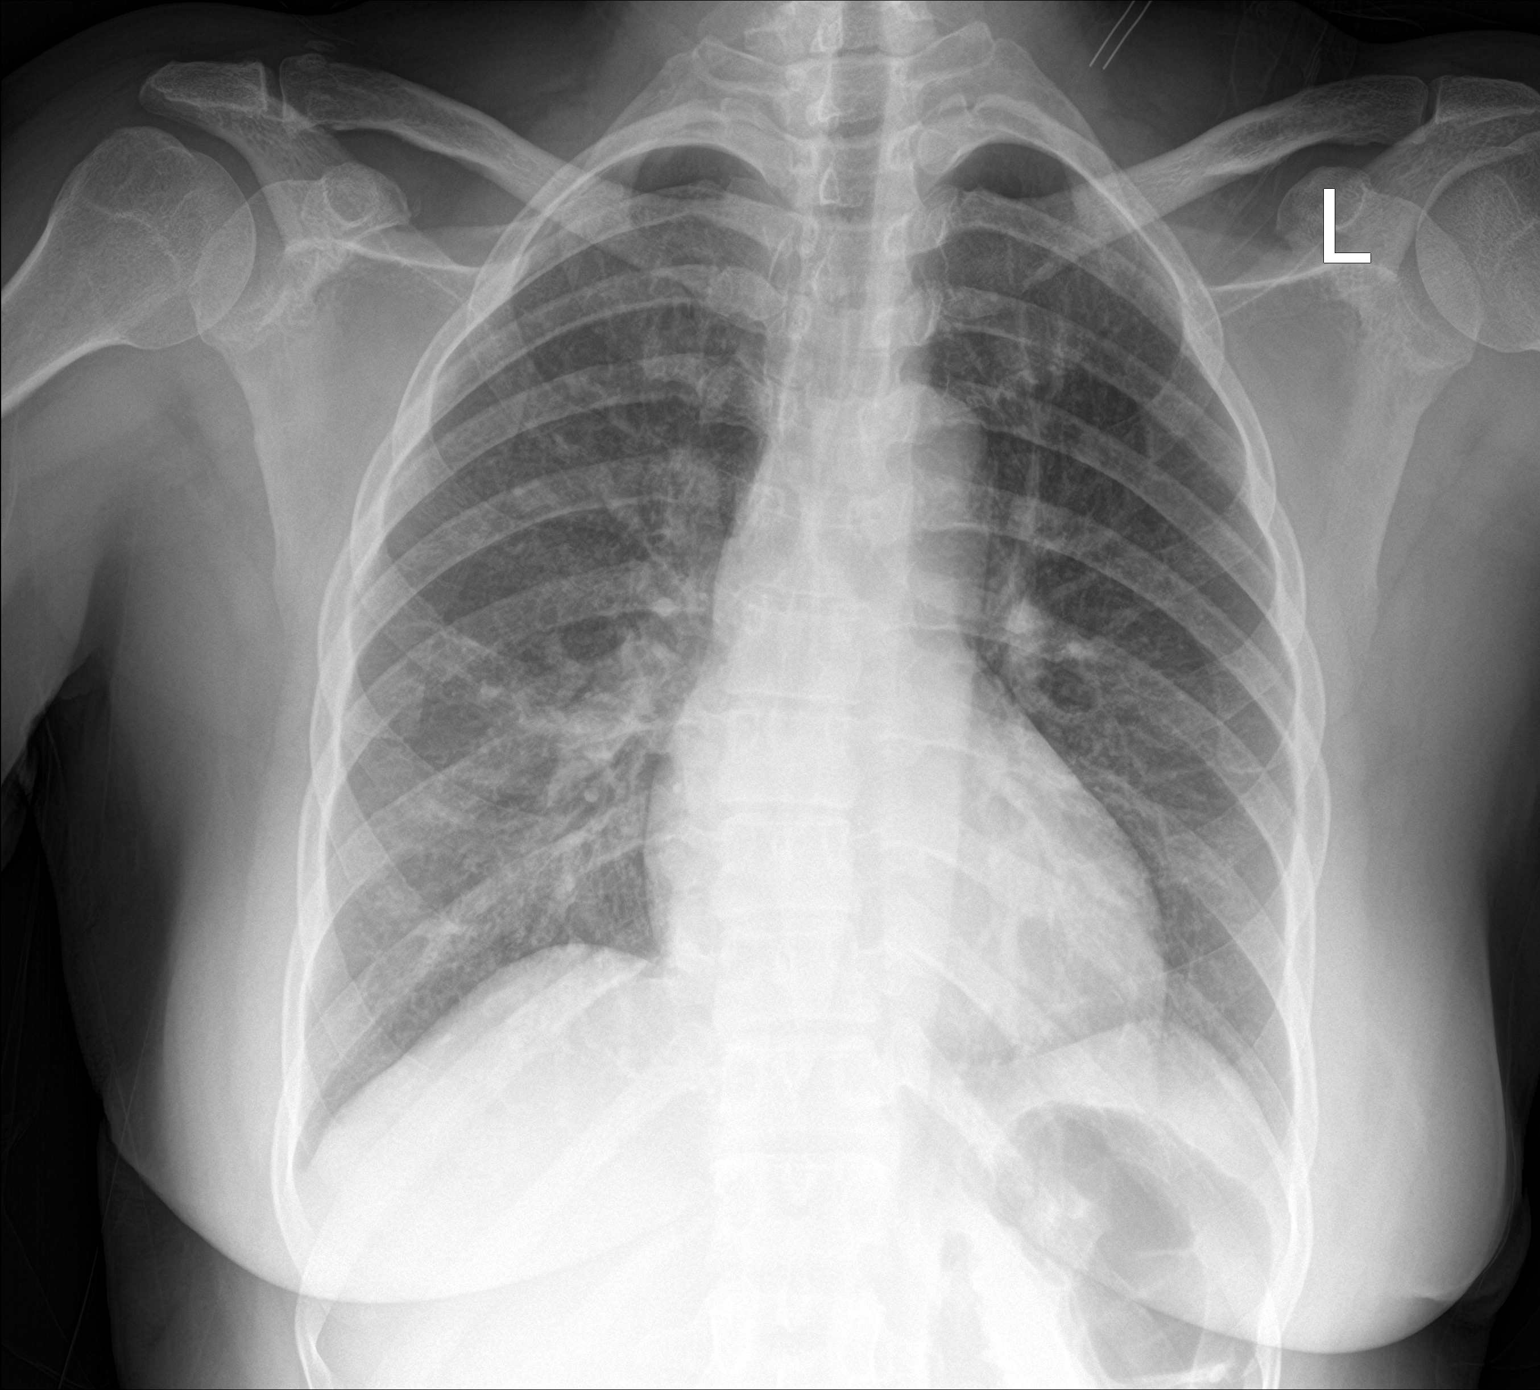

[chest lat]
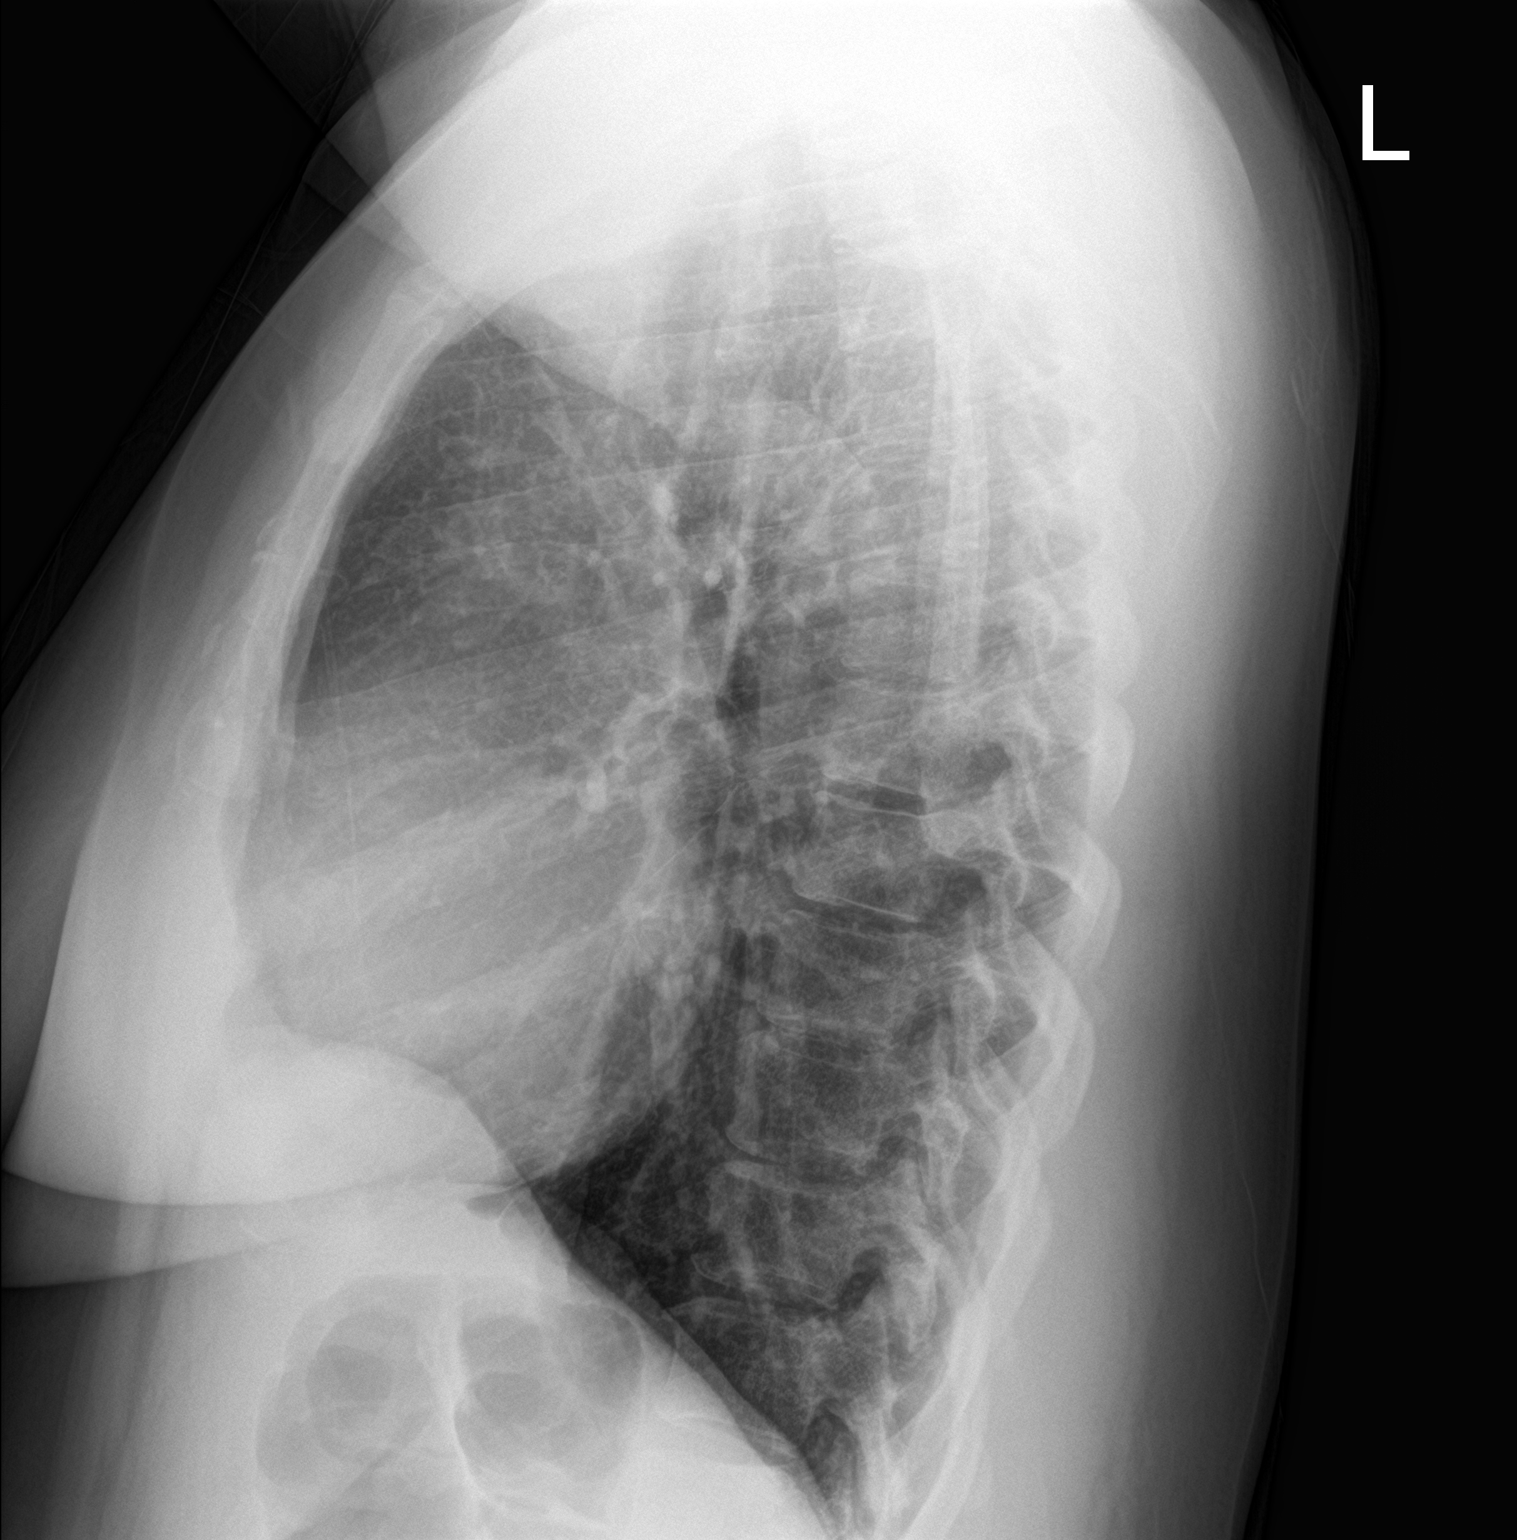

[2 of 2 positions shown; findings below may reference images not displayed]

FINDINGS: The heart size and mediastinal contours are within normal limits.
Both lungs are clear. The visualized skeletal structures are
unremarkable.
IMPRESSION: No active cardiopulmonary disease.
